# Patient Record
Sex: Male | Born: 1950 | Race: White | Hispanic: No
Health system: Western US, Academic
[De-identification: ages and names within clinical notes are randomized; demographics above are authoritative.]

## PROBLEM LIST (undated history)

## (undated) DIAGNOSIS — I480 Paroxysmal atrial fibrillation: Secondary | ICD-10-CM

## (undated) DIAGNOSIS — Z860100 Personal history of colon polyps, unspecified: Secondary | ICD-10-CM

## (undated) DIAGNOSIS — K649 Unspecified hemorrhoids: Secondary | ICD-10-CM

## (undated) DIAGNOSIS — H9190 Unspecified hearing loss, unspecified ear: Secondary | ICD-10-CM

## (undated) DIAGNOSIS — H269 Unspecified cataract: Secondary | ICD-10-CM

## (undated) DIAGNOSIS — I499 Cardiac arrhythmia, unspecified: Secondary | ICD-10-CM

## (undated) DIAGNOSIS — M1990 Unspecified osteoarthritis, unspecified site: Secondary | ICD-10-CM

## (undated) DIAGNOSIS — I10 Essential (primary) hypertension: Secondary | ICD-10-CM

## (undated) HISTORY — DX: Paroxysmal atrial fibrillation: I48.0

## (undated) HISTORY — DX: Personal history of colon polyps, unspecified: Z86.0100

## (undated) HISTORY — PX: KNEE ARTHROSCOPY: SHX127

## (undated) HISTORY — DX: Unspecified cataract: H26.9

## (undated) HISTORY — DX: Unspecified hemorrhoids: K64.9

## (undated) HISTORY — DX: Personal history of colonic polyps: Z86.010

---

## 2002-06-22 ENCOUNTER — Encounter

## 2004-08-27 ENCOUNTER — Ambulatory Visit

## 2005-06-23 HISTORY — PX: COLONOSCOPY: SHX174

## 2005-06-24 ENCOUNTER — Ambulatory Visit: Payer: Self-pay | Admitting: Internal Medicine

## 2005-07-04 ENCOUNTER — Encounter (INDEPENDENT_AMBULATORY_CARE_PROVIDER_SITE_OTHER): Payer: Self-pay | Admitting: *Deleted

## 2005-07-04 ENCOUNTER — Ambulatory Visit: Payer: Self-pay | Admitting: Internal Medicine

## 2005-07-04 LAB — HM COLONOSCOPY

## 2006-01-23 ENCOUNTER — Ambulatory Visit: Payer: Self-pay | Admitting: Family Medicine

## 2006-06-26 ENCOUNTER — Ambulatory Visit: Payer: Self-pay | Admitting: Family Medicine

## 2006-07-21 ENCOUNTER — Encounter

## 2006-08-21 ENCOUNTER — Ambulatory Visit: Payer: Self-pay | Admitting: Family Medicine

## 2006-08-25 ENCOUNTER — Encounter (HOSPITAL_BASED_OUTPATIENT_CLINIC_OR_DEPARTMENT_OTHER)

## 2008-02-29 ENCOUNTER — Ambulatory Visit: Payer: Self-pay | Admitting: Family Medicine

## 2010-06-23 DIAGNOSIS — I480 Paroxysmal atrial fibrillation: Secondary | ICD-10-CM

## 2010-06-23 HISTORY — DX: Paroxysmal atrial fibrillation: I48.0

## 2010-07-02 ENCOUNTER — Encounter: Payer: Self-pay | Admitting: Internal Medicine

## 2010-07-25 NOTE — Letter (Signed)
Summary: Colonoscopy Letter  [facility]  [address] [place], [place] [address]   Phone: [phone]  Fax: [phone]      July 02, 2010 MRN: [id number]   Rosalie HUNZEKER [address] [place], [place]  [address]   Dear Mr. NICOLOSI,   According to your medical record, it is time for you to schedule a Colonoscopy. The American Cancer Society recommends this procedure as a method to detect early colon cancer. Patients with a family history of colon cancer, or a personal history of colon polyps or inflammatory bowel disease are at increased risk.  This letter has been generated based on the recommendations made at the time of your procedure. If you feel that in your particular situation this may no longer apply, please contact our office.  Please call our office at [phone] to schedule this appointment or to update your records at your earliest convenience.  Thank you for cooperating with [place] to provide you with the very best care possible.   Sincerely,  Hedwig Morton. Juanda Chance, M.D.  [facility] [phone]

## 2010-11-11 ENCOUNTER — Encounter: Payer: Self-pay | Admitting: Family Medicine

## 2010-11-11 ENCOUNTER — Ambulatory Visit (INDEPENDENT_AMBULATORY_CARE_PROVIDER_SITE_OTHER): Payer: Federal, State, Local not specified - PPO | Admitting: Family Medicine

## 2010-11-11 ENCOUNTER — Ambulatory Visit
Admission: RE | Admit: 2010-11-11 | Discharge: 2010-11-11 | Disposition: A | Discharge status: Home / Self Care | Payer: Federal, State, Local not specified - PPO | Source: Ambulatory Visit | Attending: Family Medicine | Admitting: Family Medicine

## 2010-11-11 VITALS — BP 126/80 | HR 74 | Ht 69.6 in | Wt 190.0 lb

## 2010-11-11 DIAGNOSIS — M25512 Pain in left shoulder: Secondary | ICD-10-CM

## 2010-11-11 DIAGNOSIS — Z0000 Encounter for general adult medical examination without abnormal findings: Secondary | ICD-10-CM

## 2010-11-11 DIAGNOSIS — R000 Tachycardia, unspecified: Secondary | ICD-10-CM

## 2010-11-11 DIAGNOSIS — Z8042 Family history of malignant neoplasm of prostate: Secondary | ICD-10-CM

## 2010-11-11 DIAGNOSIS — M25519 Pain in unspecified shoulder: Secondary | ICD-10-CM

## 2010-11-11 LAB — COMPREHENSIVE METABOLIC PANEL
ALT: 19 U/L (ref 0–53)
AST: 21 U/L (ref 0–37)
Albumin: 4.7 g/dL (ref 3.5–5.2)
CO2: 27 mEq/L (ref 19–32)
Calcium: 9.7 mg/dL (ref 8.4–10.5)
Chloride: 101 mEq/L (ref 96–112)
Potassium: 4.5 mEq/L (ref 3.5–5.3)
Sodium: 139 mEq/L (ref 135–145)
Total Protein: 6.9 g/dL (ref 6.0–8.3)

## 2010-11-11 LAB — CBC WITH DIFFERENTIAL/PLATELET
Basophils Absolute: 0 10*3/uL (ref 0.0–0.1)
Basophils Relative: 1 % (ref 0–1)
Eosinophils Absolute: 0.2 10*3/uL (ref 0.0–0.7)
HCT: 39 % (ref 39.0–52.0)
Hemoglobin: 13.6 g/dL (ref 13.0–17.0)
MCH: 32.1 pg (ref 26.0–34.0)
MCHC: 34.9 g/dL (ref 30.0–36.0)
Monocytes Absolute: 0.6 10*3/uL (ref 0.1–1.0)
Monocytes Relative: 8 % (ref 3–12)
Neutro Abs: 4.1 10*3/uL (ref 1.7–7.7)
RDW: 13 % (ref 11.5–15.5)

## 2010-11-11 LAB — TSH: TSH: 2.023 u[IU]/mL (ref 0.350–4.500)

## 2010-11-11 LAB — POCT URINALYSIS DIPSTICK
Blood, UA: NEGATIVE
Ketones, UA: NEGATIVE
Leukocytes, UA: NEGATIVE
Nitrite, UA: NEGATIVE
Protein, UA: NEGATIVE
pH, UA: 6

## 2010-11-11 LAB — LIPID PANEL
LDL Cholesterol: 139 mg/dL — ABNORMAL HIGH (ref 0–99)
Triglycerides: 73 mg/dL (ref <150)
VLDL: 15 mg/dL (ref 0–40)

## 2010-11-11 NOTE — Progress Notes (Signed)
  Subjective:    Patient ID: Matthew Mason, male    DOB: [birth year], 60 y.o.   MRN: [id number]  HPI he is here for a complete examination. He states that he has had almost daily occurrences of rapid heart rate to approximately 150. The last for approximately 1 minute but no associated shortness of breath, weakness or pain. His had no skin or hair changes. Mother apparently did have an irregular heart rate requiring an intervention. His father died of an MI. He also complains of left shoulder pain. He notes that certain positions make it worse. He has no history of previous injury. No popping, locking or grinding. There is a family history of prostate cancer. His social and family history were reviewed. He continues to drink at least 5 beers per night. His marriage is going well.   Review of Systems  Constitutional: Negative.   HENT: Negative.   Eyes: Negative.   Respiratory: Negative.   Gastrointestinal: Negative.   Genitourinary: Negative.   Skin: Negative.   Neurological: Negative.   Hematological: Negative.        Objective:   Physical Exam BP 126/80  Pulse 74  Ht 5' 9.6" (1.768 m)  Wt 190 lb (86.183 kg)  BMI 27.58 kg/m2  General Appearance:    Alert, cooperative, no distress, appears stated age  Head:    Normocephalic, without obvious abnormality, atraumatic  Eyes:    PERRL, conjunctiva/corneas clear, EOM's intact, fundi    benign  Ears:    Normal TM's and external ear canals  Nose:   Nares normal, mucosa normal, no drainage or sinus   tenderness  Throat:   Lips, mucosa, and tongue normal; teeth and gums normal  Neck:   Supple, no lymphadenopathy;  thyroid:  no   enlargement/tenderness/nodules; no carotid   bruit or JVD  Back:    Spine nontender, no curvature, ROM normal, no CVA     tenderness  Lungs:     Clear to auscultation bilaterally without wheezes, rales or     ronchi; respirations unlabored  Chest Wall:    No tenderness or deformity   Heart:    Regular rate and  rhythm, S1 and S2 normal, no murmur, rub   or gallop  Breast Exam:    No chest wall tenderness, masses or gynecomastia  Abdomen:     Soft, non-tender, nondistended, normoactive bowel sounds,    no masses, no hepatosplenomegaly  Genitalia:    Normal male external genitalia without lesions.  Testicles without masses.  No inguinal hernias.  Rectal:    Normal sphincter tone, no masses or tenderness; guaiac negative stool.  Prostate smooth, no nodules, not enlarged.  Extremities:   No clubbing, cyanosis or edema  Pulses:   2+ and symmetric all extremities  Skin:   Skin color, texture, turgor normal, no rashes or lesions  Lymph nodes:   Cervical, supraclavicular, and axillary nodes normal  Neurologic:   CNII-XII intact, normal strength, sensation and gait; reflexes 2+ and symmetric throughout          Psych:   Normal mood, affect, hygiene and grooming.    EKG shows no acute changes       Assessment & Plan:  A. chronic problem list I also discussed the fact that he is drinking more alcohol than needed. Strongly encouraged him to cut back on this. I will followup on the x-ray. Will also order an event monitor. Routine blood screening.

## 2010-11-12 ENCOUNTER — Telehealth: Payer: Self-pay

## 2010-11-12 NOTE — Telephone Encounter (Signed)
Pt informed of labs and to call for new apt for shoulder also I mailed diet info along with labs to pt

## 2010-11-22 HISTORY — PX: NM MYOVIEW LTD: HXRAD82

## 2010-11-22 HISTORY — PX: TRANSTHORACIC ECHOCARDIOGRAM: SHX275

## 2010-11-23 ENCOUNTER — Inpatient Hospital Stay (HOSPITAL_COMMUNITY)
Admission: EM | Admit: 2010-11-23 | Discharge: 2010-11-24 | DRG: 139 | Disposition: A | Discharge status: Home / Self Care | Payer: Federal, State, Local not specified - PPO | Attending: Cardiology | Admitting: Cardiology

## 2010-11-23 ENCOUNTER — Emergency Department (HOSPITAL_COMMUNITY): Payer: Federal, State, Local not specified - PPO

## 2010-11-23 DIAGNOSIS — I4891 Unspecified atrial fibrillation: Principal | ICD-10-CM | POA: Diagnosis present

## 2010-11-23 DIAGNOSIS — Z8249 Family history of ischemic heart disease and other diseases of the circulatory system: Secondary | ICD-10-CM

## 2010-11-23 DIAGNOSIS — Z87891 Personal history of nicotine dependence: Secondary | ICD-10-CM

## 2010-11-23 LAB — DIFFERENTIAL
Basophils Relative: 1 % (ref 0–1)
Lymphocytes Relative: 46 % (ref 12–46)
Lymphs Abs: 3.2 10*3/uL (ref 0.7–4.0)
Monocytes Relative: 10 % (ref 3–12)
Neutro Abs: 2.8 10*3/uL (ref 1.7–7.7)
Neutrophils Relative %: 40 % — ABNORMAL LOW (ref 43–77)

## 2010-11-23 LAB — CBC
Hemoglobin: 14.2 g/dL (ref 13.0–17.0)
MCH: 32.3 pg (ref 26.0–34.0)
MCV: 90 fL (ref 78.0–100.0)
RBC: 4.39 MIL/uL (ref 4.22–5.81)

## 2010-11-23 LAB — BASIC METABOLIC PANEL
BUN: 17 mg/dL (ref 6–23)
Calcium: 9.2 mg/dL (ref 8.4–10.5)
Chloride: 103 mEq/L (ref 96–112)
GFR calc Af Amer: >60 mL/min (ref >60)
GFR calc non Af Amer: >60 mL/min (ref >60)
Glucose, Bld: 106 mg/dL — ABNORMAL HIGH (ref 70–99)

## 2010-11-23 LAB — COMPREHENSIVE METABOLIC PANEL WITH GFR
ALT: 19 U/L (ref 0–53)
BUN: 17 mg/dL (ref 6–23)
Calcium: 9.3 mg/dL (ref 8.4–10.5)
Creatinine, Ser: 0.66 mg/dL (ref 0.4–1.5)
Glucose, Bld: 109 mg/dL — ABNORMAL HIGH (ref 70–99)
Sodium: 140 meq/L (ref 135–145)
Total Protein: 7.3 g/dL (ref 6.0–8.3)

## 2010-11-23 LAB — COMPREHENSIVE METABOLIC PANEL
AST: 19 U/L (ref 0–37)
Albumin: 4.3 g/dL (ref 3.5–5.2)
Alkaline Phosphatase: 54 U/L (ref 39–117)
CO2: 26 mEq/L (ref 19–32)
Chloride: 103 mEq/L (ref 96–112)
GFR calc Af Amer: >60 mL/min (ref >60)
GFR calc non Af Amer: >60 mL/min (ref >60)
Potassium: 4 mEq/L (ref 3.5–5.1)
Total Bilirubin: 0.3 mg/dL (ref 0.3–1.2)

## 2010-11-23 LAB — CK TOTAL AND CKMB (NOT AT ARMC): Total CK: 158 U/L (ref 7–232)

## 2010-11-23 LAB — APTT: aPTT: 28 seconds (ref 24–37)

## 2010-11-23 LAB — PROTIME-INR
INR: 0.89 (ref 0.00–1.49)
Prothrombin Time: 12.3 s (ref 11.6–15.2)

## 2010-11-23 LAB — TROPONIN I: Troponin I: <0.3 ng/mL (ref <0.30)

## 2010-11-23 LAB — MAGNESIUM: Magnesium: 2.3 mg/dL (ref 1.5–2.5)

## 2010-11-24 LAB — CBC
MCHC: 35.6 g/dL (ref 30.0–36.0)
Platelets: 236 10*3/uL (ref 150–400)
RDW: 12.5 % (ref 11.5–15.5)
WBC: 6.4 10*3/uL (ref 4.0–10.5)

## 2010-11-24 LAB — BASIC METABOLIC PANEL
CO2: 27 mEq/L (ref 19–32)
Calcium: 8.8 mg/dL (ref 8.4–10.5)
Creatinine, Ser: 0.76 mg/dL (ref 0.4–1.5)
GFR calc Af Amer: >60 mL/min (ref >60)
GFR calc non Af Amer: >60 mL/min (ref >60)

## 2010-11-24 LAB — CARDIAC PANEL(CRET KIN+CKTOT+MB+TROPI)
CK, MB: 2.5 ng/mL (ref 0.3–4.0)
CK, MB: 2.9 ng/mL (ref 0.3–4.0)
Relative Index: 2.5 (ref 0.0–2.5)
Total CK: 118 U/L (ref 7–232)
Total CK: 98 U/L (ref 7–232)
Troponin I: <0.3 ng/mL (ref <0.30)

## 2010-11-24 LAB — LIPID PANEL
Cholesterol: 166 mg/dL (ref 0–200)
VLDL: 17 mg/dL (ref 0–40)

## 2010-11-24 LAB — HEPARIN LEVEL (UNFRACTIONATED)
Heparin Unfractionated: 0.41 IU/mL (ref 0.30–0.70)
Heparin Unfractionated: 0.52 IU/mL (ref 0.30–0.70)

## 2010-11-28 NOTE — Discharge Summary (Signed)
  NAME:  Matthew Mason, Matthew Mason NO.:  [id number]  MEDICAL RECORD NO.:  [id number]           PATIENT TYPE:  I  LOCATION:  2915                         FACILITY:  [facility]  PHYSICIAN:  Landry Corporal, MD DATE OF BIRTH:  08-21-1950  DATE OF ADMISSION:  11/23/2010 DATE OF DISCHARGE:  11/24/2010                              DISCHARGE SUMMARY   DISCHARGE DIAGNOSES: 1. Paroxysmal atrial fibrillation, new onset, converted spontaneously     to sinus rhythm. 2. Remote history of smoking. 3. Family history of coronary artery disease.  HOSPITAL COURSE:  Matthew Mason is a 60 year old male who is followed by Dr. Susann Givens.  He had mentioned to Dr. Susann Givens on a recent history and physical that he had had some palpitations.  The patient was set up for an outpatient monitor.  On the morning of admission around 9:30, he developed some tachycardia which the monitor picked up.  The Monitor Service call us and we had him come to the emergency room.  In the emergency room he was in atrial fibrillation with a ventricular response of 90-140.  He was started on IV diltiazem and aspirin.  He converted spontaneously sometime after admission.  We feel he can be discharged later on November 24, 2010.  He will follow up with Dr. Herbie Baltimore as an outpatient.  He will need an outpatient stress test and echocardiogram. He is in sinus rhythm at discharge.  LABORATORY DATA:  White count 6.4, hemoglobin 13.8, hematocrit 38.8, platelets 236.  Sodium 140, potassium 4.2, BUN 15, creatinine 0.76.  CK- MB and troponins were negative.  Hemoglobin A1c is 5.5.  TSH 2.19. Cholesterol is 166, triglyceride 83, HDL 56, LDL 93.  Chest x-ray shows no active disease.  EKG on November 24, 2010, shows sinus rhythm, sinus bradycardia 58.  DISPOSITION:  The patient is discharged in stable condition.  He will go home on aspirin and diltiazem.  See med rec for complete details.  He will have his outpatient stress test and  echocardiogram.     Abelino Derrick, P.A.   ______________________________ Landry Corporal, MD    LKK/MEDQ  D:  11/24/2010  T:  11/25/2010  Job:  [id number]  cc:   Sharlot Gowda, M.D.  Electronically Signed by Corine Shelter P.A. on 11/25/2010 02:15:08 PM Electronically Signed by Bryan Lemma MD on 11/28/2010 12:44:31 AM

## 2010-11-28 NOTE — H&P (Signed)
NAME:  Matthew Mason, Matthew Mason NO.:  [id number]  MEDICAL RECORD NO.:  [id number]           PATIENT TYPE:  E  LOCATION:  [facility]                         FACILITY:  [facility]  PHYSICIAN:  Landry Corporal, MD DATE OF BIRTH:  August 27, 1950  DATE OF ADMISSION:  11/23/2010 DATE OF DISCHARGE:                             HISTORY & PHYSICAL   CHIEF COMPLAINTS:  Palpitations.  HISTORY OF PRESENT ILLNESS:  Mr. Dec is a pleasant 60 year old male with no prior history of coronary artery disease.  He is followed by Dr. Susann Givens.  Couple weeks ago, he had a routine physical.  He happened to mention that he had been having some episodes of palpitations.  Dr. Susann Givens set him up for a outpatient monitor, he was in sinus rhythm at the office visit.  This morning, he felt his heart rate become irregular suddenly around 9:30.  The monitor alarm went off.  He is now seen in the emergency room.  He is in fact and what appears to be coarse atrial fibrillation with a ventricular response of 90-130.  He is tolerating this well.  He denies any chest pain or shortness of breath.  He is admitted now for further evaluation.  PAST MEDICAL HISTORY:  Unremarkable for any significant medical problems, he denies hypertension and diabetes.  He has not had previous problems with arrhythmias.  His only surgery has been arthroscopic knee surgery in the past.  MEDICATIONS:  None.  ALLERGIES:  He has no known drug allergies.  SOCIAL HISTORY:  He is married.  He has two children, three grandchildren.  Quit smoking in 2000.  He is a retired Dietitian.  He is active, he goes to the gym 5 days a week.  FAMILY HISTORY:  Remarkable as father died at 49 of a heart attack.  His mother is alive and well at 80 and has atrial fibrillation.  REVIEW OF SYSTEMS:  Essentially unremarkable except as noted above.  He has no history of GI bleeding or melena.  He has not had recent fever or chills.  He denies any  exertional chest pain or anginal sounding symptoms.  He has not had unusual dyspnea.  PHYSICAL EXAM:  VITAL SIGNS:  Blood pressure 122/84, heart rate 120, O2 sat is 98% on room air, temperature 98. GENERAL:  He is a well-developed, well-nourished male, in no acute distress. HEENT:  Normocephalic, atraumatic.  Extraocular muscles are intact. Sclera is nonicteric. NECK:  Without JVD or bruit. CHEST:  Clear to auscultation to percussion. CARDIAC:  Irregularly irregular rhythm without obvious murmur, rub or gallop. ABDOMEN:  Nontender, nondistended.  No bruits. EXTREMITIES:  Without edema.  Distal pulses are 3+/4 bilaterally. NEURO:  Grossly intact.  He is awake, alert and oriented, cooperative. Moves all extremities without obvious deficit. SKIN:  Cool and dry.  LABORATORY DATA:  Labs are pending.  His EKG shows atrial fibrillation with variable ventricular response.  IMPRESSION: 1. Atrial fibrillation, onset 9:30 a.m. today. 2. Remote history of smoking. 3. Family history of coronary artery disease. 4. Unknown lipid status.  PLAN:  The patient will be admitted to telemetry, started on IV  diltiazem and heparin.  We will check labs including TSH and fasting lipids.  He has not converted in the morning, we could discontinue, cardiovert him.  He will need further cardiac evaluation including an echocardiogram and stress test, these could be done as an outpatient.     Abelino Derrick, P.A.   ______________________________ Landry Corporal, MD    LKK/MEDQ  D:  11/23/2010  T:  11/23/2010  Job:  [id number]  cc:   Sharlot Gowda, M.D.  Electronically Signed by Corine Shelter P.A. on 11/25/2010 02:15:05 PM Electronically Signed by Bryan Lemma MD on 11/28/2010 12:44:19 AM

## 2010-12-23 ENCOUNTER — Encounter: Payer: Self-pay | Admitting: Family Medicine

## 2012-03-08 ENCOUNTER — Encounter: Payer: Self-pay | Admitting: Internal Medicine

## 2012-05-04 ENCOUNTER — Encounter: Payer: Self-pay | Admitting: Internal Medicine

## 2012-05-13 ENCOUNTER — Encounter: Payer: Self-pay | Admitting: Family Medicine

## 2012-05-13 ENCOUNTER — Ambulatory Visit (INDEPENDENT_AMBULATORY_CARE_PROVIDER_SITE_OTHER): Payer: Federal, State, Local not specified - PPO | Admitting: Family Medicine

## 2012-05-13 VITALS — BP 122/70 | HR 62 | Ht 68.0 in | Wt 178.0 lb

## 2012-05-13 DIAGNOSIS — I10 Essential (primary) hypertension: Secondary | ICD-10-CM

## 2012-05-13 DIAGNOSIS — Z8042 Family history of malignant neoplasm of prostate: Secondary | ICD-10-CM

## 2012-05-13 DIAGNOSIS — Z0000 Encounter for general adult medical examination without abnormal findings: Secondary | ICD-10-CM

## 2012-05-13 DIAGNOSIS — F1010 Alcohol abuse, uncomplicated: Secondary | ICD-10-CM

## 2012-05-13 DIAGNOSIS — Z789 Other specified health status: Secondary | ICD-10-CM

## 2012-05-13 DIAGNOSIS — Z2911 Encounter for prophylactic immunotherapy for respiratory syncytial virus (RSV): Secondary | ICD-10-CM

## 2012-05-13 DIAGNOSIS — L918 Other hypertrophic disorders of the skin: Secondary | ICD-10-CM

## 2012-05-13 DIAGNOSIS — L909 Atrophic disorder of skin, unspecified: Secondary | ICD-10-CM

## 2012-05-13 DIAGNOSIS — I4891 Unspecified atrial fibrillation: Secondary | ICD-10-CM

## 2012-05-13 DIAGNOSIS — Z23 Encounter for immunization: Secondary | ICD-10-CM

## 2012-05-13 DIAGNOSIS — I480 Paroxysmal atrial fibrillation: Secondary | ICD-10-CM

## 2012-05-13 LAB — CBC WITH DIFFERENTIAL/PLATELET
Hemoglobin: 14 g/dL (ref 13.0–17.0)
Lymphs Abs: 1.6 10*3/uL (ref 0.7–4.0)
Monocytes Relative: 14 % — ABNORMAL HIGH (ref 3–12)
Neutro Abs: 3.1 10*3/uL (ref 1.7–7.7)
Neutrophils Relative %: 53 % (ref 43–77)
RBC: 4.3 MIL/uL (ref 4.22–5.81)

## 2012-05-13 LAB — POCT URINALYSIS DIPSTICK
Blood, UA: NEGATIVE
Nitrite, UA: NEGATIVE
Spec Grav, UA: 1.025
Urobilinogen, UA: NEGATIVE

## 2012-05-13 LAB — COMPREHENSIVE METABOLIC PANEL
AST: 182 U/L — ABNORMAL HIGH (ref 0–37)
BUN: 10 mg/dL (ref 6–23)
Calcium: 9 mg/dL (ref 8.4–10.5)
Chloride: 103 mEq/L (ref 96–112)
Creat: 0.77 mg/dL (ref 0.50–1.35)
Total Bilirubin: 0.8 mg/dL (ref 0.3–1.2)

## 2012-05-13 LAB — LIPID PANEL
Cholesterol: 189 mg/dL (ref 0–200)
LDL Cholesterol: 91 mg/dL (ref 0–99)
VLDL: 12 mg/dL (ref 0–40)

## 2012-05-13 LAB — PSA: PSA: 0.81 ng/mL (ref <=4.00)

## 2012-05-13 NOTE — Progress Notes (Signed)
Subjective:    Patient ID: Matthew Mason, male    DOB: 1950-08-25, 61 y.o.   MRN: [id number]  HPI He is here for complete examination. He did state that he and his wife are getting ready to get a divorce. Apparently she had an extramarital affair. Since she has left he does feel psychologically much better under a lot less stress. He continues to drink at least 4 beverages per night and is truly not interested in quitting. He only drinks at home. He also has skin tags in the neck and axillary area that tends to bother him intermittently. He is now on blood pressure medication as well as Xarelto for treatment of his PAT. He last saw a cardiologist in April. Family and social history were reviewed. He has no other concerns or complaints.   Review of Systems  Constitutional: Negative.   HENT: Negative.   Eyes: Negative.   Respiratory: Negative.   Cardiovascular: Negative.   Gastrointestinal: Negative.   Genitourinary: Negative.   Musculoskeletal: Negative.   Neurological: Negative.   Hematological: Negative.   Psychiatric/Behavioral: Negative.        Objective:   Physical Exam BP 122/70  Pulse 62  Ht 5\' 8"  (1.727 m)  Wt 178 lb (80.74 kg)  BMI 27.06 kg/m2  General Appearance:    Alert, cooperative, no distress, appears stated age  Head:    Normocephalic, without obvious abnormality, atraumatic  Eyes:    PERRL, conjunctiva/corneas clear, EOM's intact, fundi    benign  Ears:    Normal TM's and external ear canals  Nose:   Nares normal, mucosa normal, no drainage or sinus   tenderness  Throat:   Lips, mucosa, and tongue normal; teeth and gums normal  Neck:   Supple, no lymphadenopathy;  thyroid:  no   enlargement/tenderness/nodules; no carotid   bruit or JVD  Back:    Spine nontender, no curvature, ROM normal, no CVA     tenderness  Lungs:     Clear to auscultation bilaterally without wheezes, rales or     ronchi; respirations unlabored  Chest Wall:    No tenderness or  deformity   Heart:    Regular rate and rhythm, S1 and S2 normal, no murmur, rub   or gallop  Breast Exam:    No chest wall tenderness, masses or gynecomastia  Abdomen:     Soft, non-tender, nondistended, normoactive bowel sounds,    no masses, no hepatosplenomegaly  Genitalia:   deferred   Rectal:   deferred   Extremities:   No clubbing, cyanosis or edema  Pulses:   2+ and symmetric all extremities  Skin:   Skin color, texture, turgor normal, no rashes.skin tags noted in the neck as well as axillary area.   Lymph nodes:   Cervical, supraclavicular, and axillary nodes normal  Neurologic:   CNII-XII intact, normal strength, sensation and gait; reflexes 2+ and symmetric throughout          Psych:   Normal mood, affect, hygiene and grooming.           Assessment & Plan:   1. Routine general medical examination at a health care facility  POCT Urinalysis Dipstick, Tdap vaccine greater than or equal to 7yo IM, Flu vaccine greater than or equal to 3yo preservative free IM, Varicella-zoster vaccine subcutaneous, Lipid panel, CBC with Differential, Comprehensive metabolic panel, PSA  2. Family history of prostate cancer  PSA  3. Heavy alcohol use  CBC with Differential, Comprehensive metabolic  panel  4. Hypertension    5. Paroxysmal atrial fibrillation    6. Cutaneous skin tags     review his record indicates need for followup colonoscopy. I recommended that he set this up. He is also to return here for skin tag excision. Discussed his alcohol use and strongly encouraged him to decrease this to one or 2 beverages per night. Continue on present medication regimen. His immunizations were updated. We discussed risk and benefits.

## 2012-05-14 NOTE — Progress Notes (Signed)
Quick Note:  Pt has an appt Monday ______

## 2012-05-17 ENCOUNTER — Ambulatory Visit (INDEPENDENT_AMBULATORY_CARE_PROVIDER_SITE_OTHER): Payer: Federal, State, Local not specified - PPO | Admitting: Family Medicine

## 2012-05-17 DIAGNOSIS — R748 Abnormal levels of other serum enzymes: Secondary | ICD-10-CM

## 2012-05-17 NOTE — Progress Notes (Signed)
  Subjective:    Patient ID: Matthew Mason, male    DOB: 04-Dec-1950, 61 y.o.   MRN: [id number]  HPI He is here for consult. Recent blood work did show elevated liver enzymes. Review his record indicates they were normal a little over a year ago.   Review of Systems     Objective:   Physical Exam Alert and in no distress otherwise not examined       Assessment & Plan:   1. Elevated liver enzymes    I discussed the liver enzymes in regard to his alcohol consumption. Explained that the liver enzymes are most likely related to this. Encouraged him to abstain from all alcohol consumption. He agreed to do this. He will return here in 6 weeks for recheck on his liver enzymes. We'll also remove his skin tags at that time.

## 2012-05-25 ENCOUNTER — Encounter: Payer: Self-pay | Admitting: Family Medicine

## 2012-06-28 ENCOUNTER — Other Ambulatory Visit: Payer: Self-pay | Admitting: Family Medicine

## 2012-06-28 ENCOUNTER — Ambulatory Visit (INDEPENDENT_AMBULATORY_CARE_PROVIDER_SITE_OTHER): Payer: Federal, State, Local not specified - PPO | Admitting: Family Medicine

## 2012-06-28 ENCOUNTER — Encounter: Payer: Self-pay | Admitting: Family Medicine

## 2012-06-28 VITALS — BP 124/78 | HR 56 | Wt 169.0 lb

## 2012-06-28 DIAGNOSIS — R7402 Elevation of levels of lactic acid dehydrogenase (LDH): Secondary | ICD-10-CM

## 2012-06-28 DIAGNOSIS — L918 Other hypertrophic disorders of the skin: Secondary | ICD-10-CM

## 2012-06-28 NOTE — Progress Notes (Signed)
  Subjective:    Patient ID: Matthew Mason, male    DOB: [birth year], 62 y.o.   MRN: [id number]  HPI He is here for a recheck. He notes a 9 pound weight loss, sleeping better and less difficulty with A. fib. Some notes that he has more money in his pocket. He also has several skin tags that he would like removed.   Review of Systems     Objective:   Physical Exam Alert and in no distress. Small skin tag on left side of neck was removed without difficulty. He also has one in the right axilla that was cleaned with alcohol and clipped. The base was hyfrecated with silver nitrate.       Assessment & Plan:   1. Nonspecific elevation of levels of transaminase or lactic acid dehydrogenase (LDH)  Comprehensive metabolic panel  2. Cutaneous skin tags

## 2012-06-29 ENCOUNTER — Other Ambulatory Visit: Payer: Self-pay

## 2012-06-29 DIAGNOSIS — R748 Abnormal levels of other serum enzymes: Secondary | ICD-10-CM

## 2012-06-29 LAB — HEPATITIS B SURFACE ANTIGEN: Hepatitis B Surface Ag: NEGATIVE

## 2012-06-29 LAB — COMPREHENSIVE METABOLIC PANEL
ALT: 234 U/L — ABNORMAL HIGH (ref 0–53)
AST: 105 U/L — ABNORMAL HIGH (ref 0–37)
Albumin: 4.2 g/dL (ref 3.5–5.2)
BUN: 12 mg/dL (ref 6–23)
CO2: 29 mEq/L (ref 19–32)
Calcium: 9 mg/dL (ref 8.4–10.5)
Chloride: 104 mEq/L (ref 96–112)
Potassium: 4.3 mEq/L (ref 3.5–5.3)

## 2012-06-29 NOTE — Progress Notes (Signed)
Quick Note:  Pt informed more labs being run and pt has apt at [employer] [facility]. 301 jan 10 at 9 am pt aware ______

## 2012-06-29 NOTE — Progress Notes (Signed)
Quick Note:  Let him know that the enzymes are better but still not normal. See if we can do hepatitis B and C. on his blood. Also order a liver ultrasound. ______

## 2012-07-02 ENCOUNTER — Other Ambulatory Visit: Payer: Federal, State, Local not specified - PPO

## 2012-07-05 ENCOUNTER — Ambulatory Visit
Admission: RE | Admit: 2012-07-05 | Discharge: 2012-07-05 | Disposition: A | Discharge status: Home / Self Care | Payer: Federal, State, Local not specified - PPO | Source: Ambulatory Visit | Attending: Family Medicine | Admitting: Family Medicine

## 2012-07-05 ENCOUNTER — Other Ambulatory Visit: Payer: Self-pay

## 2012-07-05 ENCOUNTER — Telehealth: Payer: Self-pay

## 2012-07-05 DIAGNOSIS — R748 Abnormal levels of other serum enzymes: Secondary | ICD-10-CM

## 2012-07-05 NOTE — Progress Notes (Signed)
Quick Note:  Pt verbalized understanding   ______

## 2012-07-05 NOTE — Progress Notes (Signed)
Quick Note:  Let him know that the bloodwork does show an improvement and the liver ultrasound did not show anything abnormal. Have him abstain from alcohol again for another couple months and let's repeat this. Let him know that the blood work is not totally back to normal ______

## 2012-07-05 NOTE — Telephone Encounter (Signed)
That's fine it could easily be that. He did have the blood work drawn here

## 2012-07-05 NOTE — Telephone Encounter (Signed)
Called pt about labs he said he seen his cardiologist and he seems to think that pt enzymes were high because of zerelto so he has taken him off for 4 weeks and want to get blood work done again please advise

## 2012-07-05 NOTE — Telephone Encounter (Signed)
Pt has apt to come in feb 3 to lab for blood work ok by [employer]

## 2012-07-26 ENCOUNTER — Other Ambulatory Visit: Payer: Federal, State, Local not specified - PPO

## 2012-07-26 DIAGNOSIS — R748 Abnormal levels of other serum enzymes: Secondary | ICD-10-CM

## 2012-07-26 LAB — COMPREHENSIVE METABOLIC PANEL
AST: 38 U/L — ABNORMAL HIGH (ref 0–37)
Alkaline Phosphatase: 49 U/L (ref 39–117)
BUN: 9 mg/dL (ref 6–23)
Calcium: 9.3 mg/dL (ref 8.4–10.5)
Creat: 0.74 mg/dL (ref 0.50–1.35)

## 2012-07-29 NOTE — Progress Notes (Signed)
Quick Note:  CALLED PT CELL/HOME # PT INFORMED LABS OKAY AND HE VERBALIZED UNDERSTANDING ______

## 2012-11-29 IMAGING — CR DG SHOULDER 2+V*L*
3 series · 3 of 3 positions shown · non-contrast
Comparison: None.

CLINICAL DATA: Left shoulder pain

LEFT SHOULDER - 2+ VIEW

[view not recorded (1 of 3)]
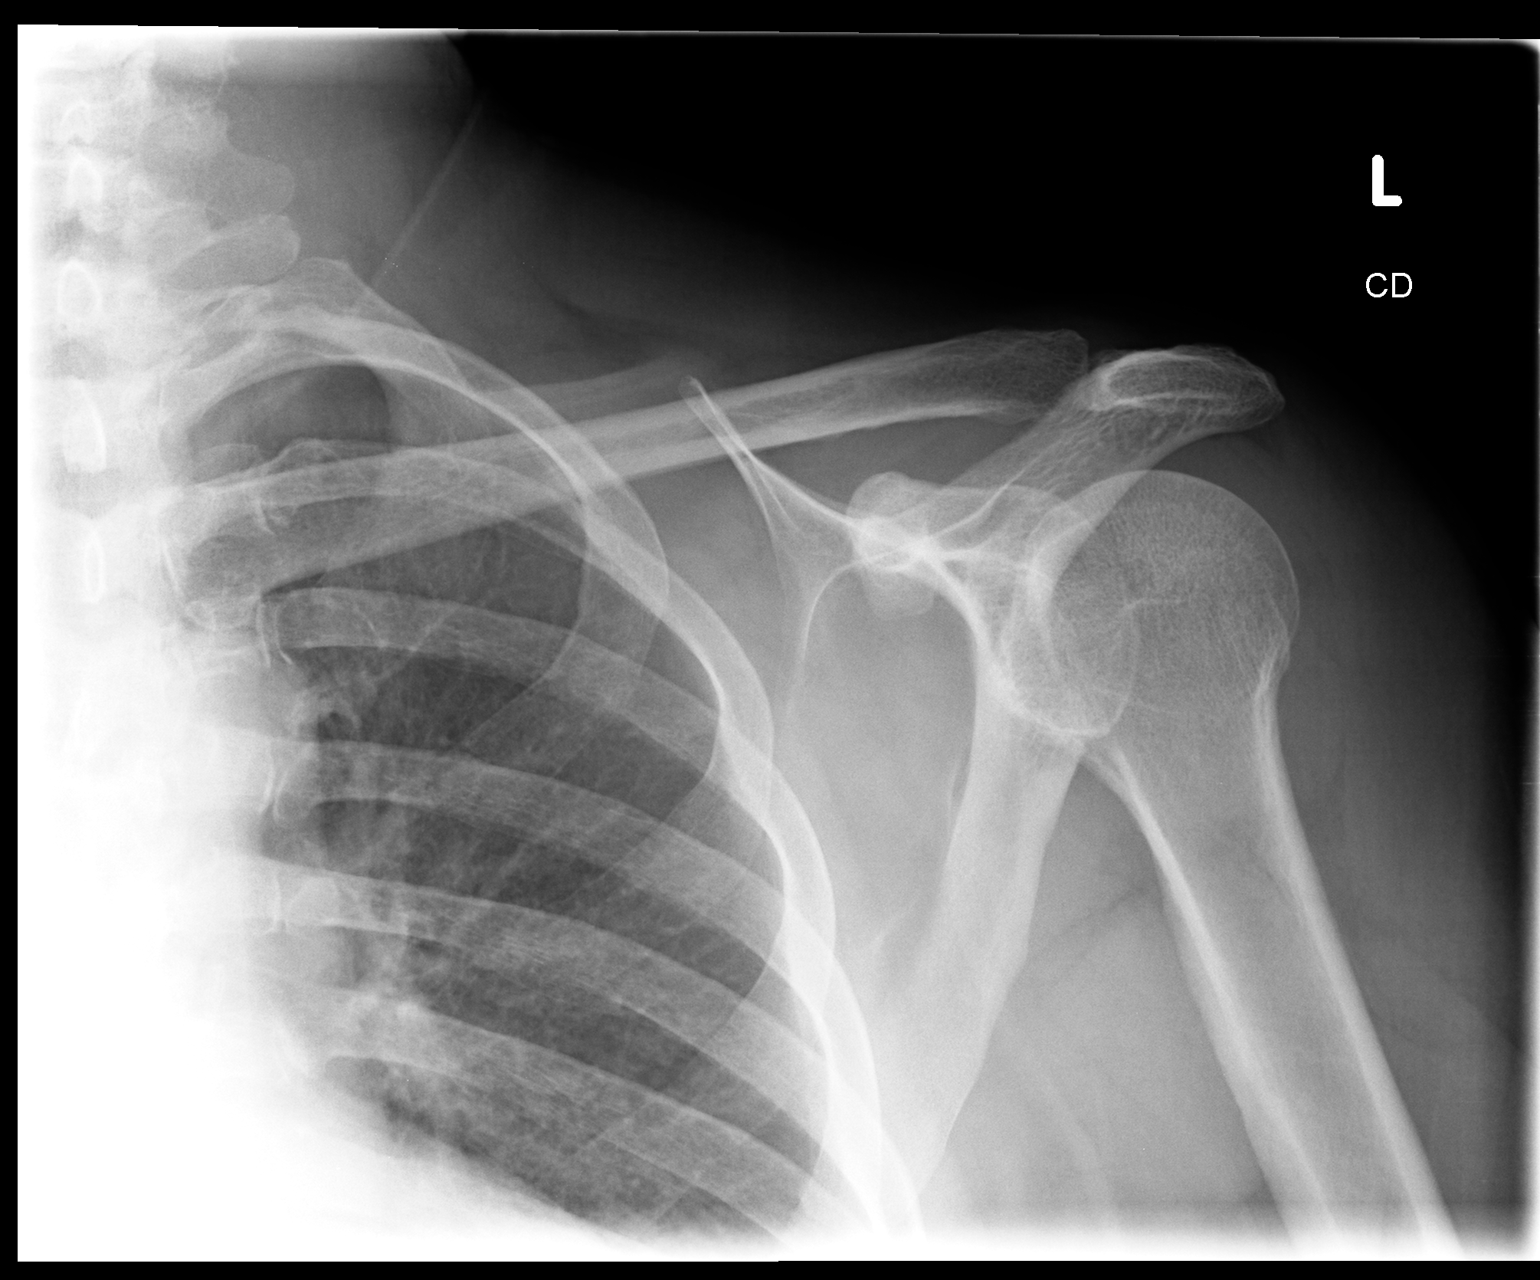

[view not recorded (2 of 3)]
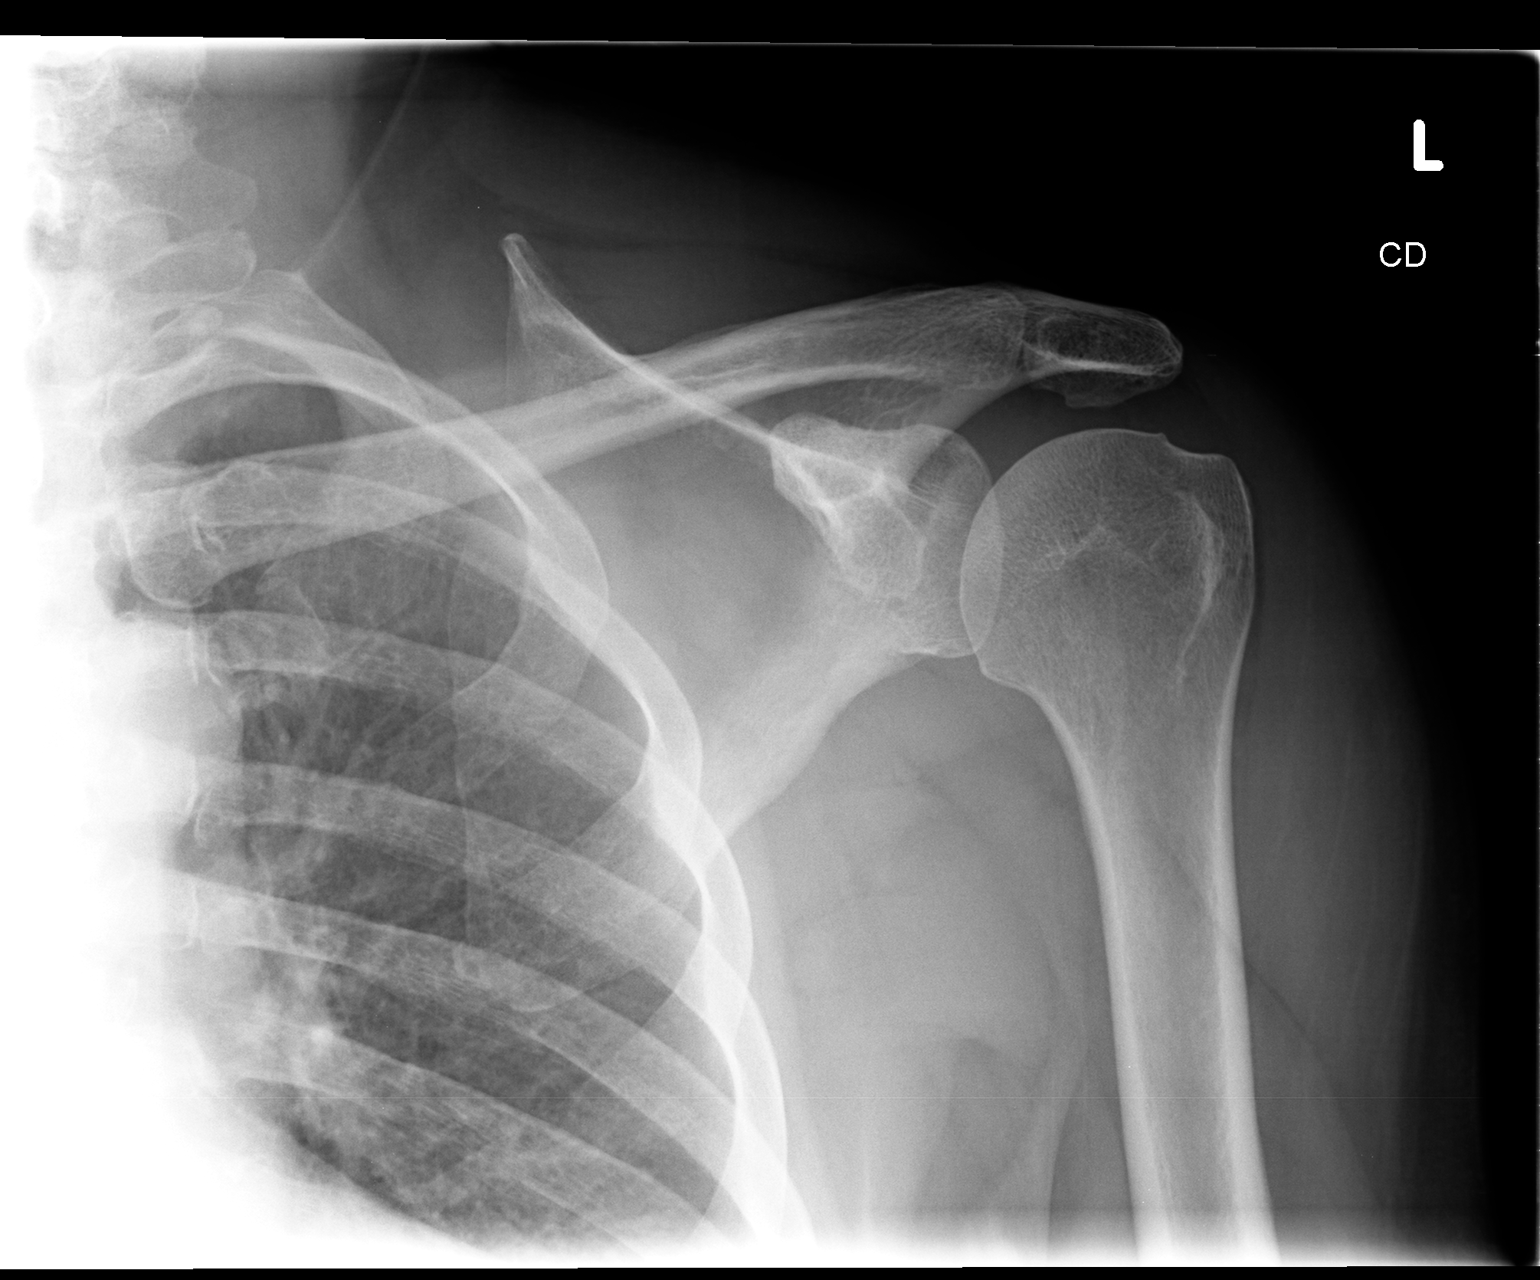

[view not recorded (3 of 3)]
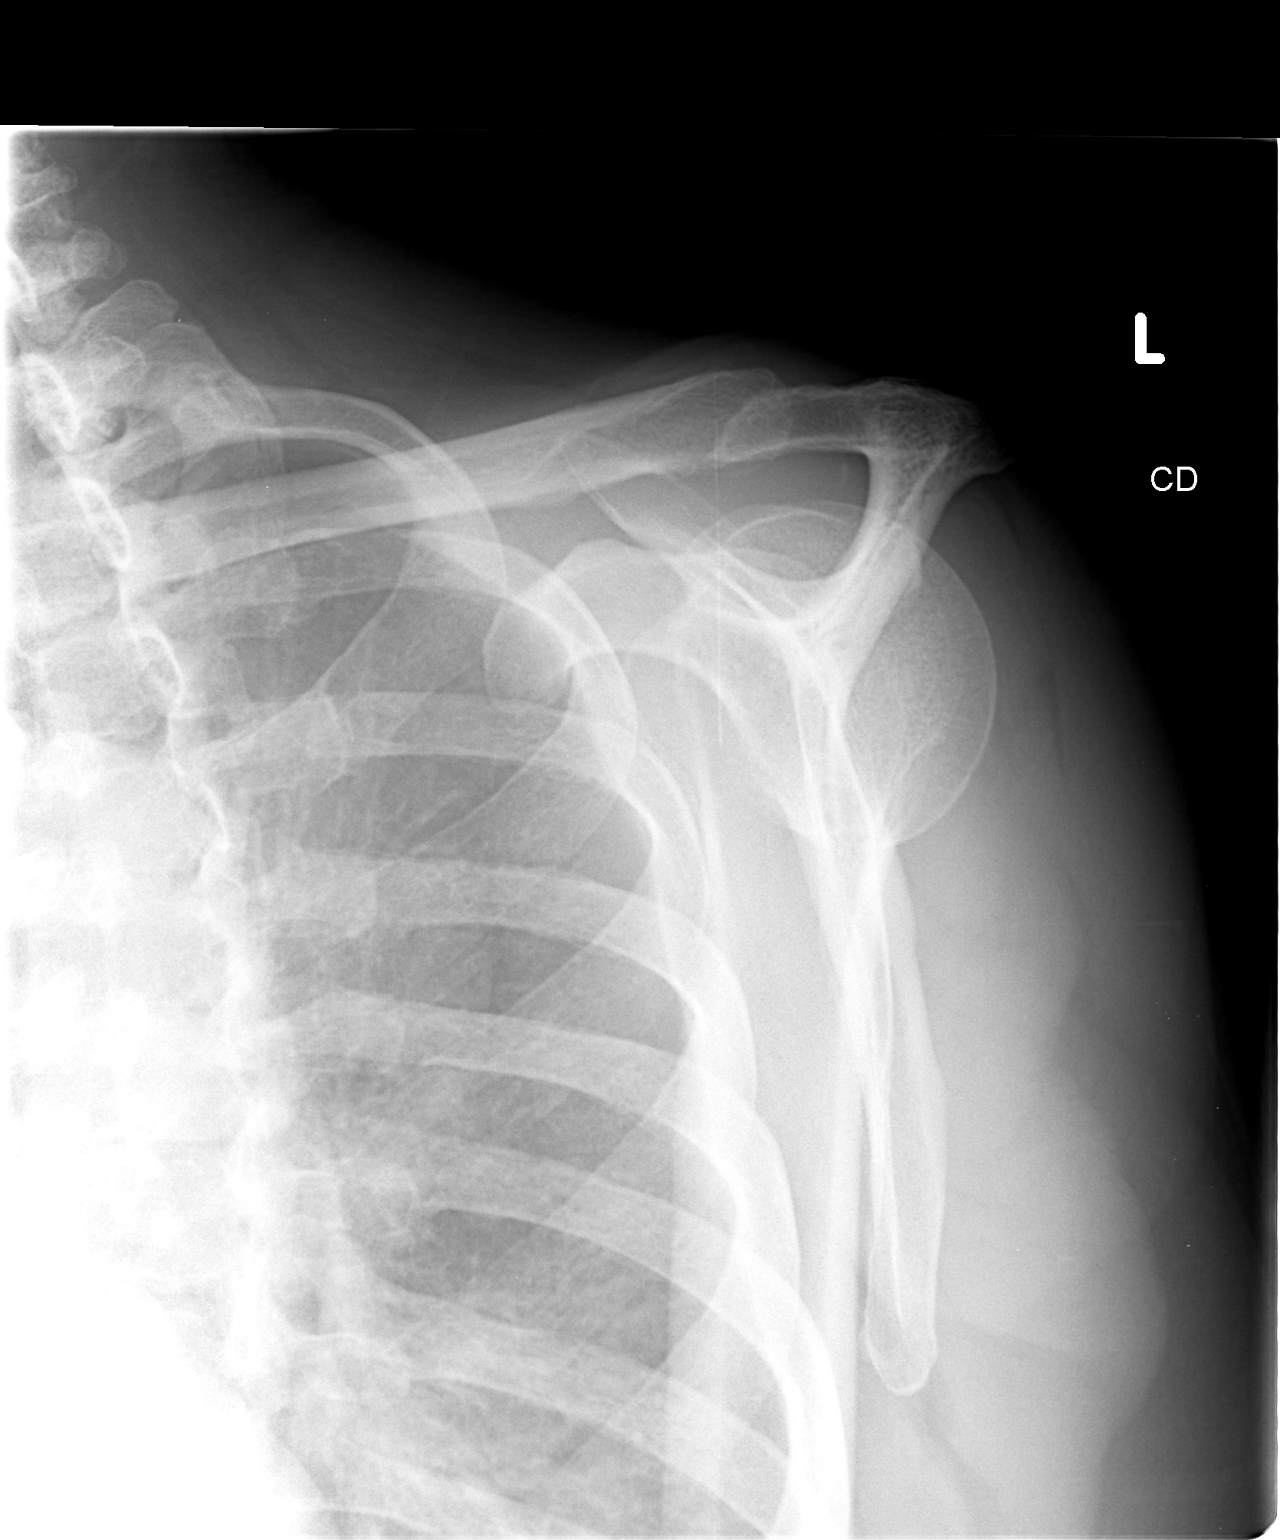

[3 of 3 positions shown; findings below may reference images not displayed]

FINDINGS: No fracture.  No dislocation.  There are no significant
degenerative changes.  The underlying ribs are intact.  Normal soft
tissues.
IMPRESSION: Normal left shoulder radiographs

## 2012-12-13 ENCOUNTER — Other Ambulatory Visit: Payer: Self-pay | Admitting: *Deleted

## 2012-12-13 MED ORDER — LISINOPRIL 5 MG PO TABS: 5.0000 mg | ORAL_TABLET | Freq: Every day | ORAL | Status: DC

## 2012-12-13 NOTE — Telephone Encounter (Signed)
Lisinopril refilled #30 x 11

## 2012-12-14 ENCOUNTER — Other Ambulatory Visit: Payer: Self-pay | Admitting: *Deleted

## 2012-12-14 MED ORDER — LISINOPRIL 10 MG PO TABS: 10.0000 mg | ORAL_TABLET | Freq: Every day | ORAL | Status: DC

## 2012-12-14 NOTE — Telephone Encounter (Signed)
Lisinopril refilled electronically. 

## 2013-03-25 ENCOUNTER — Other Ambulatory Visit: Payer: Self-pay | Admitting: *Deleted

## 2013-03-25 MED ORDER — DILTIAZEM HCL ER COATED BEADS 120 MG PO CP24: ORAL_CAPSULE | ORAL | Status: DC

## 2013-03-25 NOTE — Telephone Encounter (Signed)
Rx was sent to pharmacy electronically. 

## 2013-09-18 ENCOUNTER — Other Ambulatory Visit: Payer: Self-pay | Admitting: Cardiovascular Disease

## 2013-12-05 ENCOUNTER — Ambulatory Visit (INDEPENDENT_AMBULATORY_CARE_PROVIDER_SITE_OTHER): Payer: Federal, State, Local not specified - PPO | Admitting: Family Medicine

## 2013-12-05 ENCOUNTER — Encounter: Payer: Self-pay | Admitting: Family Medicine

## 2013-12-05 VITALS — BP 120/70 | Wt 181.0 lb

## 2013-12-05 DIAGNOSIS — H6120 Impacted cerumen, unspecified ear: Secondary | ICD-10-CM

## 2013-12-05 DIAGNOSIS — H918X9 Other specified hearing loss, unspecified ear: Secondary | ICD-10-CM

## 2013-12-05 NOTE — Progress Notes (Signed)
   Subjective:    Patient ID: Matthew Mason, male    DOB: June 15, 1951, 63 y.o.   MRN: [id number]  HPI He recently had his hearing checked in the had difficulty due to cerumen. He is here for removal. He is having no other difficulties with his hearing.   Review of Systems     Objective:   Physical Exam Alert and in no distress. Cerumen noted in both canals. They were both irrigated with warm water and cleaned without difficulty. TMs were normal. He notes that his hearing did improve after the fact.      Assessment & Plan:  Hearing loss secondary to cerumen impaction  He is to return here in the near future to for a complete exam.

## 2014-01-04 ENCOUNTER — Encounter: Payer: Federal, State, Local not specified - PPO | Admitting: Family Medicine

## 2014-01-26 ENCOUNTER — Encounter: Payer: Self-pay | Admitting: Family Medicine

## 2014-01-26 ENCOUNTER — Ambulatory Visit (INDEPENDENT_AMBULATORY_CARE_PROVIDER_SITE_OTHER): Payer: Federal, State, Local not specified - PPO | Admitting: Family Medicine

## 2014-01-26 VITALS — BP 114/80 | HR 56 | Ht 67.0 in | Wt 180.0 lb

## 2014-01-26 DIAGNOSIS — M25469 Effusion, unspecified knee: Secondary | ICD-10-CM

## 2014-01-26 DIAGNOSIS — H9190 Unspecified hearing loss, unspecified ear: Secondary | ICD-10-CM

## 2014-01-26 DIAGNOSIS — Z0000 Encounter for general adult medical examination without abnormal findings: Secondary | ICD-10-CM

## 2014-01-26 DIAGNOSIS — I4891 Unspecified atrial fibrillation: Secondary | ICD-10-CM

## 2014-01-26 DIAGNOSIS — Z860100 Personal history of colon polyps, unspecified: Secondary | ICD-10-CM

## 2014-01-26 DIAGNOSIS — I480 Paroxysmal atrial fibrillation: Secondary | ICD-10-CM | POA: Insufficient documentation

## 2014-01-26 DIAGNOSIS — H9193 Unspecified hearing loss, bilateral: Secondary | ICD-10-CM | POA: Insufficient documentation

## 2014-01-26 DIAGNOSIS — M25461 Effusion, right knee: Secondary | ICD-10-CM

## 2014-01-26 LAB — CBC WITH DIFFERENTIAL/PLATELET
BASOS ABS: 0.1 10*3/uL (ref 0.0–0.1)
Basophils Relative: 1 % (ref 0–1)
EOS ABS: 0.2 10*3/uL (ref 0.0–0.7)
EOS PCT: 3 % (ref 0–5)
HCT: 38 % — ABNORMAL LOW (ref 39.0–52.0)
Hemoglobin: 13.2 g/dL (ref 13.0–17.0)
LYMPHS PCT: 42 % (ref 12–46)
Lymphs Abs: 2.9 10*3/uL (ref 0.7–4.0)
MCH: 32 pg (ref 26.0–34.0)
MCHC: 34.7 g/dL (ref 30.0–36.0)
MCV: 92 fL (ref 78.0–100.0)
Monocytes Absolute: 0.6 10*3/uL (ref 0.1–1.0)
Monocytes Relative: 9 % (ref 3–12)
NEUTROS PCT: 45 % (ref 43–77)
Neutro Abs: 3.1 10*3/uL (ref 1.7–7.7)
PLATELETS: 184 10*3/uL (ref 150–400)
RBC: 4.13 MIL/uL — AB (ref 4.22–5.81)
RDW: 13.5 % (ref 11.5–15.5)
WBC: 6.8 10*3/uL (ref 4.0–10.5)

## 2014-01-26 NOTE — Progress Notes (Signed)
   Subjective:    Patient ID: ANTOIN Mason, male    DOB: May 08, 1951, 63 y.o.   MRN: [id number]  HPI He is here for complete examination. He does have a history of PAF. He has not been on any medicine other than eloquence for the last several months. He notes only occasional issues of rapid heart rate. He does have a previous history of colonic polyps and is due for followup colonoscopy. He recently started wearing hearing aids due to high frequency hearing loss and is doing well with them. He does complain of intermittent right knee swelling however is not interfering with his playing tennis or golf. Family and social history were reviewed. He is retired but does have 2 part-time jobs to keep him busy. He is dating someone. He otherwise has no concerns or complaints.   Review of Systems  All other systems reviewed and are negative.      Objective:   Physical Exam BP 114/80  Pulse 56  Ht 5\' 7"  (1.702 m)  Wt 180 lb (81.647 kg)  BMI 28.19 kg/m2  General Appearance:    Alert, cooperative, no distress, appears stated age  Head:    Normocephalic, without obvious abnormality, atraumatic  Eyes:    PERRL, conjunctiva/corneas clear, EOM's intact, fundi    benign  Ears:    Normal TM's and external ear canals  Nose:   Nares normal, mucosa normal, no drainage or sinus   tenderness  Throat:   Lips, mucosa, and tongue normal; teeth and gums normal  Neck:   Supple, no lymphadenopathy;  thyroid:  no   enlargement/tenderness/nodules; no carotid   bruit or JVD  Back:    Spine nontender, no curvature, ROM normal, no CVA     tenderness  Lungs:     Clear to auscultation bilaterally without wheezes, rales or     ronchi; respirations unlabored  Chest Wall:    No tenderness or deformity   Heart:    Regular rate and rhythm, S1 and S2 normal, no murmur, rub   or gallop  Breast Exam:    No chest wall tenderness, masses or gynecomastia  Abdomen:     Soft, non-tender, nondistended, normoactive bowel  sounds,    no masses, no hepatosplenomegaly  Genitalia:    Rectal:    Extremities:   No clubbing, cyanosis or edema  Pulses:   2+ and symmetric all extremities  Skin:   Skin color, texture, turgor normal, no rashes or lesions  Lymph nodes:   Cervical, supraclavicular, and axillary nodes normal  Neurologic:   CNII-XII intact, normal strength, sensation and gait; reflexes 2+ and symmetric throughout          Psych:   Normal mood, affect, hygiene and grooming.          Assessment & Plan:  Atrial fibrillation, unspecified - Plan: CBC with Differential, Comprehensive metabolic panel, Lipid panel  Personal history of colonic polyps  Bilateral hearing loss  Knee effusion, right  Routine general medical examination at a health care facility - Plan: CBC with Differential, Comprehensive metabolic panel  he will schedule himself to be seen by GI for followup colonoscopy. He is going to continue to use his hearing aids. Discussed the knee effusion and since he is really having no functional impairment, further intervention is probably not needed. He was comfortable with this. He will follow up with cardiology concerning continuing on his Eliquis

## 2014-01-27 LAB — COMPREHENSIVE METABOLIC PANEL
ALT: 14 U/L (ref 0–53)
AST: 18 U/L (ref 0–37)
Albumin: 4.6 g/dL (ref 3.5–5.2)
Alkaline Phosphatase: 51 U/L (ref 39–117)
BILIRUBIN TOTAL: 1 mg/dL (ref 0.2–1.2)
BUN: 11 mg/dL (ref 6–23)
CO2: 26 mEq/L (ref 19–32)
CREATININE: 0.68 mg/dL (ref 0.50–1.35)
Calcium: 8.8 mg/dL (ref 8.4–10.5)
Chloride: 102 mEq/L (ref 96–112)
Glucose, Bld: 87 mg/dL (ref 70–99)
Potassium: 4 mEq/L (ref 3.5–5.3)
SODIUM: 137 meq/L (ref 135–145)
TOTAL PROTEIN: 6.8 g/dL (ref 6.0–8.3)

## 2014-01-27 LAB — LIPID PANEL
CHOL/HDL RATIO: 2.4 ratio
Cholesterol: 176 mg/dL (ref 0–200)
HDL: 74 mg/dL (ref >39)
LDL CALC: 86 mg/dL (ref 0–99)
Triglycerides: 79 mg/dL (ref <150)
VLDL: 16 mg/dL (ref 0–40)

## 2014-03-20 ENCOUNTER — Other Ambulatory Visit (HOSPITAL_BASED_OUTPATIENT_CLINIC_OR_DEPARTMENT_OTHER): Payer: Self-pay | Admitting: Anatomic Pathology

## 2014-03-25 ENCOUNTER — Inpatient Hospital Stay
Admission: EM | Admit: 2014-03-25 | Discharge: 2014-04-25 | DRG: 270 | Disposition: A | Discharge status: Hospice | Source: Other Acute Inpatient Hospital | Attending: Cardiovascular Disease | Admitting: Cardiovascular Disease

## 2014-03-25 ENCOUNTER — Inpatient Hospital Stay (HOSPITAL_COMMUNITY): Admitting: Cardiovascular Disease

## 2014-03-25 DIAGNOSIS — R251 Tremor, unspecified: Secondary | ICD-10-CM | POA: Diagnosis present

## 2014-03-25 DIAGNOSIS — N183 Chronic kidney disease, stage 3 (moderate): Secondary | ICD-10-CM | POA: Diagnosis present

## 2014-03-25 DIAGNOSIS — E46 Unspecified protein-calorie malnutrition: Secondary | ICD-10-CM | POA: Diagnosis present

## 2014-03-25 DIAGNOSIS — J90 Pleural effusion, not elsewhere classified: Secondary | ICD-10-CM

## 2014-03-25 DIAGNOSIS — Z955 Presence of coronary angioplasty implant and graft: Secondary | ICD-10-CM

## 2014-03-25 DIAGNOSIS — E876 Hypokalemia: Secondary | ICD-10-CM | POA: Diagnosis present

## 2014-03-25 DIAGNOSIS — I5023 Acute on chronic systolic (congestive) heart failure: Secondary | ICD-10-CM | POA: Diagnosis present

## 2014-03-25 DIAGNOSIS — I2510 Atherosclerotic heart disease of native coronary artery without angina pectoris: Secondary | ICD-10-CM | POA: Diagnosis present

## 2014-03-25 DIAGNOSIS — I480 Paroxysmal atrial fibrillation: Secondary | ICD-10-CM | POA: Diagnosis present

## 2014-03-25 DIAGNOSIS — F4322 Adjustment disorder with anxiety: Secondary | ICD-10-CM | POA: Diagnosis present

## 2014-03-25 DIAGNOSIS — K7290 Hepatic failure, unspecified without coma: Secondary | ICD-10-CM | POA: Diagnosis present

## 2014-03-25 DIAGNOSIS — R570 Cardiogenic shock: Secondary | ICD-10-CM | POA: Diagnosis present

## 2014-03-25 DIAGNOSIS — F064 Anxiety disorder due to known physiological condition: Secondary | ICD-10-CM | POA: Diagnosis present

## 2014-03-25 DIAGNOSIS — Z95810 Presence of automatic (implantable) cardiac defibrillator: Secondary | ICD-10-CM

## 2014-03-25 DIAGNOSIS — I130 Hypertensive heart and chronic kidney disease with heart failure and stage 1 through stage 4 chronic kidney disease, or unspecified chronic kidney disease: Principal | ICD-10-CM | POA: Diagnosis present

## 2014-03-25 DIAGNOSIS — E871 Hypo-osmolality and hyponatremia: Secondary | ICD-10-CM | POA: Diagnosis present

## 2014-03-25 DIAGNOSIS — K761 Chronic passive congestion of liver: Secondary | ICD-10-CM | POA: Diagnosis present

## 2014-03-25 DIAGNOSIS — I252 Old myocardial infarction: Secondary | ICD-10-CM

## 2014-03-25 DIAGNOSIS — Z66 Do not resuscitate: Secondary | ICD-10-CM | POA: Diagnosis not present

## 2014-03-25 DIAGNOSIS — I255 Ischemic cardiomyopathy: Secondary | ICD-10-CM | POA: Diagnosis present

## 2014-03-25 DIAGNOSIS — N179 Acute kidney failure, unspecified: Secondary | ICD-10-CM | POA: Diagnosis present

## 2014-03-25 DIAGNOSIS — Z8674 Personal history of sudden cardiac arrest: Secondary | ICD-10-CM

## 2014-03-25 DIAGNOSIS — M109 Gout, unspecified: Secondary | ICD-10-CM | POA: Diagnosis present

## 2014-03-25 LAB — CBC, DIFF
% Basophils: 0 %
% Eosinophils: 2 %
% Immature Granulocytes: 1 %
% Lymphocytes: 14 %
% Monocytes: 13 %
% Neutrophils: 70 %
% Nucleated RBC: 0 %
Absolute Eosinophil Count: 0.14 10*3/uL (ref 0.00–0.50)
Absolute Lymphocyte Count: 0.98 10*3/uL — ABNORMAL LOW (ref 1.00–4.80)
Basophils: 0.03 10*3/uL (ref 0.00–0.20)
Hematocrit: 33 % — ABNORMAL LOW (ref 38–50)
Hemoglobin: 10.8 g/dL — ABNORMAL LOW (ref 13.0–18.0)
Immature Granulocytes: 0.04 10*3/uL (ref 0.00–0.05)
MCH: 31.3 pg (ref 27.3–33.6)
MCHC: 32.8 g/dL (ref 32.2–36.5)
MCV: 95 fL (ref 81–98)
Monocytes: 0.91 10*3/uL — ABNORMAL HIGH (ref 0.00–0.80)
Neutrophils: 4.98 10*3/uL (ref 1.80–7.00)
Nucleated RBC: 0 10*3/uL
Platelet Count: 101 10*3/uL — ABNORMAL LOW (ref 150–400)
RBC: 3.45 mil/uL — ABNORMAL LOW (ref 4.40–5.60)
RDW-CV: 18.8 % — ABNORMAL HIGH (ref 11.6–14.4)
WBC: 7.08 10*3/uL (ref 4.3–10.0)

## 2014-03-25 LAB — BASIC METABOLIC PANEL
Anion Gap: 13 — ABNORMAL HIGH (ref 4–12)
Calcium: 9.2 mg/dL (ref 8.9–10.2)
Carbon Dioxide, Total: 26 mEq/L (ref 22–32)
Chloride: 90 mEq/L — ABNORMAL LOW (ref 98–108)
Creatinine: 1.39 mg/dL — ABNORMAL HIGH (ref 0.51–1.18)
GFR, Calc, African American: >60 mL/min (ref >59)
GFR, Calc, European American: 52 mL/min — ABNORMAL LOW (ref >59)
Glucose: 174 mg/dL — ABNORMAL HIGH (ref 62–125)
Potassium: 3.3 mEq/L — ABNORMAL LOW (ref 3.6–5.2)
Sodium: 129 mEq/L — ABNORMAL LOW (ref 135–145)
Urea Nitrogen: 47 mg/dL — ABNORMAL HIGH (ref 8–21)

## 2014-03-25 LAB — HEPATIC FUNCTION PANEL
ALT (GPT): 28 U/L (ref 10–48)
AST (GOT): 31 U/L (ref 9–38)
Albumin: 3.8 g/dL (ref 3.5–5.2)
Alkaline Phosphatase (Total): 163 U/L — ABNORMAL HIGH (ref 37–159)
Bilirubin (Direct): 1.3 mg/dL — ABNORMAL HIGH (ref 0.0–0.3)
Bilirubin (Total): 2.7 mg/dL — ABNORMAL HIGH (ref 0.2–1.3)
Protein (Total): 6.5 g/dL (ref 6.0–8.2)

## 2014-03-25 LAB — COOXIMETRY PANEL, MIXED VEN
Carboxyhemoglobin, Mixed VEN: 1.5 %
Hemoglobin, MIXED VEN: 11 g/dL — ABNORMAL LOW (ref 13.0–18.0)
Methemoglobin, Mixed VEN: 0.4 % (ref <3.0)
O2 Content, Mixed VEN: 9.2 VOL%
O2 Sat (Frac.), Mixed VEN: 59 %
O2 Sat (Func.), Mixed VEN: 61 %

## 2014-03-25 LAB — PROTHROMBIN & PTT
Partial Thromboplastin Time: 32 s (ref 22–35)
Prothrombin INR: 1.3 (ref 0.8–1.3)
Prothrombin Time Patient: 15.9 s — ABNORMAL HIGH (ref 10.7–15.6)

## 2014-03-25 LAB — B_TYPE NATRIURETIC PEPTIDE: B_Type Natriuretic Peptide: 2000 pg/mL — ABNORMAL HIGH (ref <101)

## 2014-03-25 LAB — MAGNESIUM: Magnesium: 2 mg/dL (ref 1.8–2.4)

## 2014-03-25 LAB — PHOSPHATE: Phosphate: 3.1 mg/dL (ref 2.5–4.5)

## 2014-03-26 DIAGNOSIS — E8770 Fluid overload, unspecified: Secondary | ICD-10-CM

## 2014-03-26 DIAGNOSIS — R9431 Abnormal electrocardiogram [ECG] [EKG]: Secondary | ICD-10-CM

## 2014-03-26 DIAGNOSIS — K7200 Acute and subacute hepatic failure without coma: Secondary | ICD-10-CM

## 2014-03-26 DIAGNOSIS — I255 Ischemic cardiomyopathy: Secondary | ICD-10-CM

## 2014-03-26 DIAGNOSIS — Z136 Encounter for screening for cardiovascular disorders: Secondary | ICD-10-CM

## 2014-03-26 LAB — BASIC METABOLIC PANEL
Anion Gap: 11 (ref 4–12)
Anion Gap: 10 (ref 4–12)
Anion Gap: 7 (ref 4–12)
Calcium: 9.3 mg/dL (ref 8.9–10.2)
Calcium: 9.4 mg/dL (ref 8.9–10.2)
Calcium: 9.3 mg/dL (ref 8.9–10.2)
Carbon Dioxide, Total: 28 mEq/L (ref 22–32)
Carbon Dioxide, Total: 28 mEq/L (ref 22–32)
Carbon Dioxide, Total: 30 mEq/L (ref 22–32)
Chloride: 91 mEq/L — ABNORMAL LOW (ref 98–108)
Chloride: 92 mEq/L — ABNORMAL LOW (ref 98–108)
Chloride: 91 mEq/L — ABNORMAL LOW (ref 98–108)
Creatinine: 1.43 mg/dL — ABNORMAL HIGH (ref 0.51–1.18)
Creatinine: 1.42 mg/dL — ABNORMAL HIGH (ref 0.51–1.18)
Creatinine: 1.45 mg/dL — ABNORMAL HIGH (ref 0.51–1.18)
GFR, Calc, African American: >60 mL/min (ref >59)
GFR, Calc, African American: >60 mL/min (ref >59)
GFR, Calc, African American: 60 mL/min (ref >59)
GFR, Calc, European American: 50 mL/min — ABNORMAL LOW (ref >59)
GFR, Calc, European American: 51 mL/min — ABNORMAL LOW (ref >59)
GFR, Calc, European American: 49 mL/min — ABNORMAL LOW (ref >59)
Glucose: 97 mg/dL (ref 62–125)
Glucose: 114 mg/dL (ref 62–125)
Glucose: 181 mg/dL — ABNORMAL HIGH (ref 62–125)
Potassium: 3.5 mEq/L — ABNORMAL LOW (ref 3.6–5.2)
Potassium: 3.7 mEq/L (ref 3.6–5.2)
Potassium: 3.8 mEq/L (ref 3.6–5.2)
Sodium: 130 mEq/L — ABNORMAL LOW (ref 135–145)
Sodium: 130 mEq/L — ABNORMAL LOW (ref 135–145)
Sodium: 128 mEq/L — ABNORMAL LOW (ref 135–145)
Urea Nitrogen: 47 mg/dL — ABNORMAL HIGH (ref 8–21)
Urea Nitrogen: 44 mg/dL — ABNORMAL HIGH (ref 8–21)
Urea Nitrogen: 43 mg/dL — ABNORMAL HIGH (ref 8–21)

## 2014-03-26 LAB — LAB ADD ON ORDER

## 2014-03-26 LAB — COOX PANEL, VENOUS
Carboxyhemoglobin, VEN: 1.4 %
Carboxyhemoglobin, VEN: 1.7 %
Methemoglobin, VEN: 0.5 % (ref <3.0)
Methemoglobin, VEN: 0.5 % (ref <3.0)
O2 Content, VEN: 8.5 VOL%
O2 Content, VEN: 7.7 VOL%
O2 Saturation (Frac.), VEN: 54 %
O2 Saturation (Frac.), VEN: 49 %
O2 Saturation (Func.), VEN: 55 %
O2 Saturation (Func.), VEN: 50 %
Total Hemoglobin, VEN: 11.2 g/dL — ABNORMAL LOW (ref 13.0–18.0)
Total Hemoglobin, VEN: 11.1 g/dL — ABNORMAL LOW (ref 13.0–18.0)

## 2014-03-26 LAB — URINALYSIS WITH REFLEX CULTURE
Bacteria, URN: NONE SEEN
Bilirubin (Qual), URN: NEGATIVE
Epith Cells_Renal/Trans,URN: NEGATIVE /HPF
Epith Cells_Squamous, URN: NEGATIVE /LPF
Glucose Qual, URN: NEGATIVE mg/dL
Ketones, URN: NEGATIVE mg/dL
Leukocyte Esterase, URN: NEGATIVE
Nitrite, URN: NEGATIVE
Protein (Alb Semiquant), URN: NEGATIVE mg/dL
Specific Gravity, URN: 1.006 g/mL (ref 1.002–1.027)
WBC, URN: NEGATIVE /HPF
pH, URN: 6 (ref 5.0–8.0)

## 2014-03-26 LAB — O2 SAT. PANEL, VEN

## 2014-03-26 LAB — REFLEX CULTURE FOR UA

## 2014-03-26 LAB — PROTHROMBIN & PTT
Partial Thromboplastin Time: 32 s (ref 22–35)
Prothrombin INR: 1.3 (ref 0.8–1.3)
Prothrombin Time Patient: 15.8 s — ABNORMAL HIGH (ref 10.7–15.6)

## 2014-03-26 LAB — COOXIMETRY PANEL, MIXED VEN
Carboxyhemoglobin, Mixed VEN: 1.7 %
Hemoglobin, MIXED VEN: 10.8 g/dL — ABNORMAL LOW (ref 13.0–18.0)
Methemoglobin, Mixed VEN: 0.5 % (ref <3.0)
O2 Content, Mixed VEN: 7.8 VOL%
O2 Sat (Frac.), Mixed VEN: 51 %
O2 Sat (Func.), Mixed VEN: 52 %

## 2014-03-26 LAB — CBC (HEMOGRAM)
Hematocrit: 33 % — ABNORMAL LOW (ref 38–50)
Hemoglobin: 10.9 g/dL — ABNORMAL LOW (ref 13.0–18.0)
MCH: 31.4 pg (ref 27.3–33.6)
MCHC: 33.4 g/dL (ref 32.2–36.5)
MCV: 94 fL (ref 81–98)
Platelet Count: 96 10*3/uL — ABNORMAL LOW (ref 150–400)
RBC: 3.47 mil/uL — ABNORMAL LOW (ref 4.40–5.60)
RDW-CV: 18.7 % — ABNORMAL HIGH (ref 11.6–14.4)
WBC: 6.97 10*3/uL (ref 4.3–10.0)

## 2014-03-26 LAB — MAGNESIUM
Magnesium: 2.1 mg/dL (ref 1.8–2.4)
Magnesium: 2.2 mg/dL (ref 1.8–2.4)
Magnesium: 2.2 mg/dL (ref 1.8–2.4)

## 2014-03-26 LAB — DIGOXIN: Digoxin: 1.7 ng/mL (ref 0.5–1.9)

## 2014-03-26 LAB — PHOSPHATE: Phosphate: 3.1 mg/dL (ref 2.5–4.5)

## 2014-03-27 DIAGNOSIS — K761 Chronic passive congestion of liver: Secondary | ICD-10-CM

## 2014-03-27 DIAGNOSIS — R570 Cardiogenic shock: Secondary | ICD-10-CM

## 2014-03-27 LAB — CBC (HEMOGRAM)
Hematocrit: 32 % — ABNORMAL LOW (ref 38–50)
Hemoglobin: 10.5 g/dL — ABNORMAL LOW (ref 13.0–18.0)
MCH: 31.3 pg (ref 27.3–33.6)
MCHC: 32.8 g/dL (ref 32.2–36.5)
MCV: 96 fL (ref 81–98)
Platelet Count: 92 10*3/uL — ABNORMAL LOW (ref 150–400)
RBC: 3.35 mil/uL — ABNORMAL LOW (ref 4.40–5.60)
RDW-CV: 18.8 % — ABNORMAL HIGH (ref 11.6–14.4)
WBC: 7.75 10*3/uL (ref 4.3–10.0)

## 2014-03-27 LAB — BASIC METABOLIC PANEL
Anion Gap: 8 (ref 4–12)
Calcium: 9.3 mg/dL (ref 8.9–10.2)
Carbon Dioxide, Total: 30 mEq/L (ref 22–32)
Chloride: 91 mEq/L — ABNORMAL LOW (ref 98–108)
Creatinine: 1.38 mg/dL — ABNORMAL HIGH (ref 0.51–1.18)
GFR, Calc, African American: >60 mL/min (ref >59)
GFR, Calc, European American: 52 mL/min — ABNORMAL LOW (ref >59)
Glucose: 151 mg/dL — ABNORMAL HIGH (ref 62–125)
Potassium: 3.6 mEq/L (ref 3.6–5.2)
Sodium: 129 mEq/L — ABNORMAL LOW (ref 135–145)
Urea Nitrogen: 42 mg/dL — ABNORMAL HIGH (ref 8–21)

## 2014-03-27 LAB — PROTHROMBIN & PTT
Partial Thromboplastin Time: 30 s (ref 22–35)
Prothrombin INR: 1.4 — ABNORMAL HIGH (ref 0.8–1.3)
Prothrombin Time Patient: 16.2 s — ABNORMAL HIGH (ref 10.7–15.6)

## 2014-03-27 LAB — MAGNESIUM: Magnesium: 2.2 mg/dL (ref 1.8–2.4)

## 2014-03-27 LAB — AMMONIA, PLASMA: Ammonia, Plasma: 46 ug/dL (ref 0–65)

## 2014-03-27 LAB — B_TYPE NATRIURETIC PEPTIDE: B_Type Natriuretic Peptide: 1671 pg/mL — ABNORMAL HIGH (ref <101)

## 2014-03-27 LAB — R/O MRSA

## 2014-03-27 LAB — PHOSPHATE: Phosphate: 3 mg/dL (ref 2.5–4.5)

## 2014-03-28 ENCOUNTER — Other Ambulatory Visit (HOSPITAL_COMMUNITY): Admit: 2014-03-28 | Payer: Self-pay

## 2014-03-28 DIAGNOSIS — K8020 Calculus of gallbladder without cholecystitis without obstruction: Secondary | ICD-10-CM

## 2014-03-28 DIAGNOSIS — I4510 Unspecified right bundle-branch block: Secondary | ICD-10-CM

## 2014-03-28 LAB — BASIC METABOLIC PANEL
Anion Gap: 10 (ref 4–12)
Anion Gap: 10 (ref 4–12)
Calcium: 9.5 mg/dL (ref 8.9–10.2)
Calcium: 9.6 mg/dL (ref 8.9–10.2)
Carbon Dioxide, Total: 29 mEq/L (ref 22–32)
Carbon Dioxide, Total: 28 mEq/L (ref 22–32)
Chloride: 89 mEq/L — ABNORMAL LOW (ref 98–108)
Chloride: 89 mEq/L — ABNORMAL LOW (ref 98–108)
Creatinine: 1.53 mg/dL — ABNORMAL HIGH (ref 0.51–1.18)
Creatinine: 1.61 mg/dL — ABNORMAL HIGH (ref 0.51–1.18)
GFR, Calc, African American: 56 mL/min — ABNORMAL LOW (ref >59)
GFR, Calc, African American: 53 mL/min — ABNORMAL LOW (ref >59)
GFR, Calc, European American: 46 mL/min — ABNORMAL LOW (ref >59)
GFR, Calc, European American: 44 mL/min — ABNORMAL LOW (ref >59)
Glucose: 157 mg/dL — ABNORMAL HIGH (ref 62–125)
Glucose: 156 mg/dL — ABNORMAL HIGH (ref 62–125)
Potassium: 3.4 mEq/L — ABNORMAL LOW (ref 3.6–5.2)
Potassium: 4.3 mEq/L (ref 3.6–5.2)
Sodium: 128 mEq/L — ABNORMAL LOW (ref 135–145)
Sodium: 127 mEq/L — ABNORMAL LOW (ref 135–145)
Urea Nitrogen: 43 mg/dL — ABNORMAL HIGH (ref 8–21)
Urea Nitrogen: 44 mg/dL — ABNORMAL HIGH (ref 8–21)

## 2014-03-28 LAB — HEPATIC FUNCTION PANEL
ALT (GPT): 22 U/L (ref 10–48)
AST (GOT): 25 U/L (ref 9–38)
Albumin: 4 g/dL (ref 3.5–5.2)
Alkaline Phosphatase (Total): 146 U/L (ref 37–159)
Bilirubin (Direct): 1.5 mg/dL — ABNORMAL HIGH (ref 0.0–0.3)
Bilirubin (Total): 3.2 mg/dL — ABNORMAL HIGH (ref 0.2–1.3)
Protein (Total): 6.6 g/dL (ref 6.0–8.2)

## 2014-03-28 LAB — URINE C/S: Colony Count: 500

## 2014-03-28 LAB — CBC (HEMOGRAM)
Hematocrit: 31 % — ABNORMAL LOW (ref 38–50)
Hemoglobin: 10.2 g/dL — ABNORMAL LOW (ref 13.0–18.0)
MCH: 30.9 pg (ref 27.3–33.6)
MCHC: 32.5 g/dL (ref 32.2–36.5)
MCV: 95 fL (ref 81–98)
Platelet Count: 107 10*3/uL — ABNORMAL LOW (ref 150–400)
RBC: 3.3 mil/uL — ABNORMAL LOW (ref 4.40–5.60)
RDW-CV: 18.5 % — ABNORMAL HIGH (ref 11.6–14.4)
WBC: 8.21 10*3/uL (ref 4.3–10.0)

## 2014-03-28 LAB — PROTHROMBIN & PTT
Partial Thromboplastin Time: 30 s (ref 22–35)
Prothrombin INR: 1.3 (ref 0.8–1.3)
Prothrombin Time Patient: 15.6 s (ref 10.7–15.6)

## 2014-03-28 LAB — MAGNESIUM: Magnesium: 2.1 mg/dL (ref 1.8–2.4)

## 2014-03-28 LAB — PHOSPHATE: Phosphate: 3.2 mg/dL (ref 2.5–4.5)

## 2014-03-28 LAB — B_TYPE NATRIURETIC PEPTIDE: B_Type Natriuretic Peptide: 2036 pg/mL — ABNORMAL HIGH (ref <101)

## 2014-03-29 ENCOUNTER — Other Ambulatory Visit (HOSPITAL_COMMUNITY): Payer: Self-pay

## 2014-03-29 ENCOUNTER — Ambulatory Visit (HOSPITAL_COMMUNITY)

## 2014-03-29 ENCOUNTER — Other Ambulatory Visit (HOSPITAL_BASED_OUTPATIENT_CLINIC_OR_DEPARTMENT_OTHER): Payer: Self-pay

## 2014-03-29 DIAGNOSIS — I5022 Chronic systolic (congestive) heart failure: Secondary | ICD-10-CM

## 2014-03-29 LAB — BASIC METABOLIC PANEL
Anion Gap: 10 (ref 4–12)
Calcium: 9.5 mg/dL (ref 8.9–10.2)
Carbon Dioxide, Total: 27 mEq/L (ref 22–32)
Chloride: 90 mEq/L — ABNORMAL LOW (ref 98–108)
Creatinine: 1.69 mg/dL — ABNORMAL HIGH (ref 0.51–1.18)
GFR, Calc, African American: 50 mL/min — ABNORMAL LOW (ref >59)
GFR, Calc, European American: 41 mL/min — ABNORMAL LOW (ref >59)
Glucose: 112 mg/dL (ref 62–125)
Potassium: 4.4 mEq/L (ref 3.6–5.2)
Sodium: 127 mEq/L — ABNORMAL LOW (ref 135–145)
Urea Nitrogen: 43 mg/dL — ABNORMAL HIGH (ref 8–21)

## 2014-03-29 LAB — HEPATIC FUNCTION PANEL
ALT (GPT): 21 U/L (ref 10–48)
AST (GOT): 25 U/L (ref 9–38)
Albumin: 4.1 g/dL (ref 3.5–5.2)
Alkaline Phosphatase (Total): 141 U/L (ref 37–159)
Bilirubin (Direct): 1.7 mg/dL — ABNORMAL HIGH (ref 0.0–0.3)
Bilirubin (Total): 3.5 mg/dL — ABNORMAL HIGH (ref 0.2–1.3)
Protein (Total): 6.8 g/dL (ref 6.0–8.2)

## 2014-03-29 LAB — COOX PANEL, VENOUS
Carboxyhemoglobin, VEN: 1.5 %
Carboxyhemoglobin, VEN: 1.5 %
Methemoglobin, VEN: 1.2 % (ref <3.0)
Methemoglobin, VEN: 0.6 % (ref <3.0)
O2 Content, VEN: 5.8 VOL%
O2 Content, VEN: 7.9 VOL%
O2 Saturation (Frac.), VEN: 38 %
O2 Saturation (Frac.), VEN: 53 %
O2 Saturation (Func.), VEN: 39 %
O2 Saturation (Func.), VEN: 54 %
Total Hemoglobin, VEN: 10.8 g/dL — ABNORMAL LOW (ref 13.0–18.0)
Total Hemoglobin, VEN: 10.7 g/dL — ABNORMAL LOW (ref 13.0–18.0)

## 2014-03-29 LAB — CALCIUM (IONIZED), WB: Calcium (Ionized): 1.12 mmol/L — ABNORMAL LOW (ref 1.18–1.38)

## 2014-03-29 LAB — CBC (HEMOGRAM)
Hematocrit: 33 % — ABNORMAL LOW (ref 38–50)
Hemoglobin: 10.6 g/dL — ABNORMAL LOW (ref 13.0–18.0)
MCH: 31 pg (ref 27.3–33.6)
MCHC: 31.9 g/dL — ABNORMAL LOW (ref 32.2–36.5)
MCV: 97 fL (ref 81–98)
Platelet Count: 134 10*3/uL — ABNORMAL LOW (ref 150–400)
RBC: 3.42 mil/uL — ABNORMAL LOW (ref 4.40–5.60)
RDW-CV: 18.7 % — ABNORMAL HIGH (ref 11.6–14.4)
WBC: 9.82 10*3/uL (ref 4.3–10.0)

## 2014-03-29 LAB — URINALYSIS WITH REFLEX CULTURE
Bacteria, URN: NONE SEEN
Bilirubin (Qual), URN: NEGATIVE
Cast_Hyaline, URN: <1 /LPF — AB
Epith Cells_Renal/Trans,URN: NEGATIVE /HPF
Epith Cells_Squamous, URN: NEGATIVE /LPF
Glucose Qual, URN: NEGATIVE mg/dL
Ketones, URN: NEGATIVE mg/dL
Leukocyte Esterase, URN: NEGATIVE
Nitrite, URN: NEGATIVE
Occult Blood, URN: NEGATIVE
Protein (Alb Semiquant), URN: NEGATIVE mg/dL
RBC, URN: NEGATIVE /HPF
Specific Gravity, URN: 1.008 g/mL (ref 1.002–1.027)
Urobilinogen, URN: 2 EHRLICH UNITS — AB
WBC, URN: NEGATIVE /HPF
pH, URN: 6 (ref 5.0–8.0)

## 2014-03-29 LAB — PATHOLOGY, SURGICAL

## 2014-03-29 LAB — PROTHROMBIN & PTT
Partial Thromboplastin Time: 28 s (ref 22–35)
Prothrombin INR: 1.4 — ABNORMAL HIGH (ref 0.8–1.3)
Prothrombin Time Patient: 16.3 s — ABNORMAL HIGH (ref 10.7–15.6)

## 2014-03-30 ENCOUNTER — Ambulatory Visit: Attending: Nurse Practitioner

## 2014-03-30 ENCOUNTER — Ambulatory Visit (HOSPITAL_BASED_OUTPATIENT_CLINIC_OR_DEPARTMENT_OTHER)

## 2014-03-30 DIAGNOSIS — Z01818 Encounter for other preprocedural examination: Secondary | ICD-10-CM | POA: Insufficient documentation

## 2014-03-30 DIAGNOSIS — I509 Heart failure, unspecified: Secondary | ICD-10-CM | POA: Insufficient documentation

## 2014-03-30 DIAGNOSIS — I6523 Occlusion and stenosis of bilateral carotid arteries: Secondary | ICD-10-CM | POA: Insufficient documentation

## 2014-03-30 LAB — CBC, DIFF
% Basophils: 0 %
% Eosinophils: 2 %
% Immature Granulocytes: 1 %
% Lymphocytes: 13 %
% Monocytes: 12 %
% Neutrophils: 72 %
Absolute Eosinophil Count: 0.16 10*3/uL (ref 0.00–0.50)
Absolute Lymphocyte Count: 1.08 10*3/uL (ref 1.00–4.80)
Basophils: 0.02 10*3/uL (ref 0.00–0.20)
Hematocrit: 30 % — ABNORMAL LOW (ref 38–50)
Hemoglobin: 9.7 g/dL — ABNORMAL LOW (ref 13.0–18.0)
Immature Granulocytes: 0.08 10*3/uL — ABNORMAL HIGH (ref 0.00–0.05)
MCH: 31.4 pg (ref 27.3–33.6)
MCHC: 32.9 g/dL (ref 32.2–36.5)
MCV: 96 fL (ref 81–98)
Monocytes: 0.98 10*3/uL — ABNORMAL HIGH (ref 0.00–0.80)
Neutrophils: 6.04 10*3/uL (ref 1.80–7.00)
Platelet Count: 126 10*3/uL — ABNORMAL LOW (ref 150–400)
RBC: 3.09 mil/uL — ABNORMAL LOW (ref 4.40–5.60)
RDW-CV: 18.7 % — ABNORMAL HIGH (ref 11.6–14.4)
WBC: 8.36 10*3/uL (ref 4.3–10.0)

## 2014-03-30 LAB — HEPATIC FUNCTION PANEL
ALT (GPT): 20 U/L (ref 10–48)
ALT (GPT): 20 U/L (ref 10–48)
AST (GOT): 25 U/L (ref 9–38)
AST (GOT): 25 U/L (ref 9–38)
Albumin: 3.9 g/dL (ref 3.5–5.2)
Albumin: 3.9 g/dL (ref 3.5–5.2)
Alkaline Phosphatase (Total): 133 U/L (ref 37–159)
Alkaline Phosphatase (Total): 138 U/L (ref 37–159)
Bilirubin (Direct): 1.7 mg/dL — ABNORMAL HIGH (ref 0.0–0.3)
Bilirubin (Direct): 1.6 mg/dL — ABNORMAL HIGH (ref 0.0–0.3)
Bilirubin (Total): 3.3 mg/dL — ABNORMAL HIGH (ref 0.2–1.3)
Bilirubin (Total): 3 mg/dL — ABNORMAL HIGH (ref 0.2–1.3)
Protein (Total): 6.4 g/dL (ref 6.0–8.2)
Protein (Total): 6.2 g/dL (ref 6.0–8.2)

## 2014-03-30 LAB — LAB ADD ON ORDER

## 2014-03-30 LAB — BASIC METABOLIC PANEL
Anion Gap: 9 (ref 4–12)
Calcium: 9.2 mg/dL (ref 8.9–10.2)
Carbon Dioxide, Total: 27 mEq/L (ref 22–32)
Chloride: 87 mEq/L — ABNORMAL LOW (ref 98–108)
Creatinine: 1.94 mg/dL — ABNORMAL HIGH (ref 0.51–1.18)
GFR, Calc, African American: 43 mL/min — ABNORMAL LOW (ref >59)
GFR, Calc, European American: 35 mL/min — ABNORMAL LOW (ref >59)
Glucose: 142 mg/dL — ABNORMAL HIGH (ref 62–125)
Potassium: 4 mEq/L (ref 3.6–5.2)
Sodium: 123 mEq/L — ABNORMAL LOW (ref 135–145)
Urea Nitrogen: 43 mg/dL — ABNORMAL HIGH (ref 8–21)

## 2014-03-30 LAB — LIPID PANEL
Cholesterol (LDL): 37 mg/dL (ref <130)
Cholesterol/HDL Ratio: 2.3
HDL Cholesterol: 38 mg/dL — ABNORMAL LOW (ref >39)
Non-HDL Cholesterol: 51 mg/dL (ref 0–159)
Total Cholesterol: 89 mg/dL (ref <200)
Triglyceride: 70 mg/dL (ref <150)

## 2014-03-30 LAB — IRON BINDING CAPACITY (W/IRON, TRANSFERRIN & TRANSF SAT)
Iron, SRM: 48 ug/dL (ref 31–171)
Total Iron Binding Capacity: 372 ug/dL (ref 250–460)
Transferrin Saturation: 13 % — ABNORMAL LOW (ref 15–50)
Transferrin: 266 mg/dL (ref 180–329)

## 2014-03-30 LAB — CBC (HEMOGRAM)
Hematocrit: 30 % — ABNORMAL LOW (ref 38–50)
Hemoglobin: 9.8 g/dL — ABNORMAL LOW (ref 13.0–18.0)
MCH: 31.3 pg (ref 27.3–33.6)
MCHC: 32.9 g/dL (ref 32.2–36.5)
MCV: 95 fL (ref 81–98)
Platelet Count: 128 10*3/uL — ABNORMAL LOW (ref 150–400)
RBC: 3.13 mil/uL — ABNORMAL LOW (ref 4.40–5.60)
RDW-CV: 18.8 % — ABNORMAL HIGH (ref 11.6–14.4)
WBC: 8.23 10*3/uL (ref 4.3–10.0)

## 2014-03-30 LAB — VITAMIN D (25 HYDROXY)
Vit D (25_Hydroxy) Total: 28 ng/mL (ref 20.1–50.0)
Vitamin D2 (25_Hydroxy): <1 ng/mL
Vitamin D3 (25_Hydroxy): 28 ng/mL

## 2014-03-30 LAB — CK, CREATINE KINASE, TOTAL ACTIVITY: Creatine Kinase Total Activity: 79 U/L (ref 62–325)

## 2014-03-30 LAB — T3 (FREE): T3 (Free): 2.4 pg/mL (ref 2.3–3.9)

## 2014-03-30 LAB — COOXIMETRY PANEL, MIXED VEN
Carboxyhemoglobin, Mixed VEN: 1.4 %
Carboxyhemoglobin, Mixed VEN: 1.2 %
Hemoglobin, MIXED VEN: 10.2 g/dL — ABNORMAL LOW (ref 13.0–18.0)
Hemoglobin, MIXED VEN: 10.1 g/dL — ABNORMAL LOW (ref 13.0–18.0)
Methemoglobin, Mixed VEN: 0.6 % (ref <3.0)
Methemoglobin, Mixed VEN: 0.6 % (ref <3.0)
O2 Content, Mixed VEN: 6.7 VOL%
O2 Content, Mixed VEN: 5.8 VOL%
O2 Sat (Frac.), Mixed VEN: 47 %
O2 Sat (Frac.), Mixed VEN: 41 %
O2 Sat (Func.), Mixed VEN: 48 %
O2 Sat (Func.), Mixed VEN: 41 %

## 2014-03-30 LAB — GAMMA GLUTAMYL TRANSFERASE: Gamma Glutamyl Transferase: 311 U/L — ABNORMAL HIGH (ref 0–55)

## 2014-03-30 LAB — CALCIUM (IONIZED), WB: Calcium (Ionized): 1.11 mmol/L — ABNORMAL LOW (ref 1.18–1.38)

## 2014-03-30 LAB — MAGNESIUM
Magnesium: 2.2 mg/dL (ref 1.8–2.4)
Magnesium: 2.3 mg/dL (ref 1.8–2.4)

## 2014-03-30 LAB — THYROID STIMULATING HORMONE: Thyroid Stimulating Hormone: 3.462 u[IU]/mL (ref 0.400–5.000)

## 2014-03-30 LAB — PROTHROMBIN TIME
Prothrombin INR: 1.4 — ABNORMAL HIGH (ref 0.8–1.3)
Prothrombin INR: 1.4 — ABNORMAL HIGH (ref 0.8–1.3)
Prothrombin Time Patient: 16.3 s — ABNORMAL HIGH (ref 10.7–15.6)
Prothrombin Time Patient: 16.8 s — ABNORMAL HIGH (ref 10.7–15.6)

## 2014-03-30 LAB — T3: Triiodothyronine (T3): 49 ng/dL — ABNORMAL LOW (ref 73–178)

## 2014-03-30 LAB — LACTATE DEHYDROGENASE: Lactate Dehydrogenase: 283 U/L — ABNORMAL HIGH (ref <210)

## 2014-03-30 LAB — TRANSTHYRETIN (PRE-ALBUMIN): Transthyretin (Pre_Albumin): 14.3 mg/dL — ABNORMAL LOW (ref 19.0–45.0)

## 2014-03-30 LAB — URIC ACID, SERUM: Uric Acid: 7.8 mg/dL — ABNORMAL HIGH (ref 3.9–7.6)

## 2014-03-30 LAB — ABO, RH (SENDOUT)

## 2014-03-30 LAB — PSA, DIAGNOSTIC/MONITORING: PSA, Diagnostic/Monitoring: 0.11 ng/mL (ref 0.00–4.00)

## 2014-03-30 LAB — T4, FREE: Thyroxine (Free): 1.4 ng/dL — ABNORMAL HIGH (ref 0.6–1.2)

## 2014-03-30 LAB — PHOSPHATE: Phosphate: 3.4 mg/dL (ref 2.5–4.5)

## 2014-03-30 LAB — AMYLASE: Amylase Total: 33 U/L (ref 27–106)

## 2014-03-30 LAB — HLA TESTING (SENDOUT)

## 2014-03-30 LAB — LIPASE: Lipase: 39 U/L (ref <70)

## 2014-03-30 LAB — FERRITIN: Ferritin: 198 ng/mL (ref 20–230)

## 2014-03-30 LAB — SED RATE: Erythrocyte Sedimentation Rate: 14 mm/hr (ref 0–15)

## 2014-03-30 LAB — THYROXINE (T4): Thyroxine (T4): 7.8 ug/dL (ref 4.8–10.8)

## 2014-03-31 ENCOUNTER — Other Ambulatory Visit (HOSPITAL_BASED_OUTPATIENT_CLINIC_OR_DEPARTMENT_OTHER): Payer: Self-pay

## 2014-03-31 DIAGNOSIS — F05 Delirium due to known physiological condition: Secondary | ICD-10-CM

## 2014-03-31 DIAGNOSIS — Z452 Encounter for adjustment and management of vascular access device: Secondary | ICD-10-CM

## 2014-03-31 DIAGNOSIS — I5020 Unspecified systolic (congestive) heart failure: Secondary | ICD-10-CM

## 2014-03-31 DIAGNOSIS — R931 Abnormal findings on diagnostic imaging of heart and coronary circulation: Secondary | ICD-10-CM

## 2014-03-31 DIAGNOSIS — F4322 Adjustment disorder with anxiety: Secondary | ICD-10-CM

## 2014-03-31 LAB — PROTEIN ELECTROPHORESIS
Albumin: 3.9 g/dL (ref 3.5–4.9)
Alpha 1: 0.3 g/dL (ref 0.1–0.3)
Alpha 2: 0.6 g/dL (ref 0.3–0.8)
Beta: 0.7 g/dL (ref 0.6–1.0)
Electrophoresis Interp:: NORMAL
Gamma: 0.7 g/dL (ref 0.4–1.4)
Protein (Total): 6.3 g/dL (ref 6.0–8.2)

## 2014-03-31 LAB — ANTI TOXOPLASMA, IGG
Anti Toxoplasma, IgG Interp: NONREACTIVE
Anti Toxoplasma, IgG Result: <3.2 IU/mL (ref 0.0–7.4)

## 2014-03-31 LAB — CBC (HEMOGRAM)
Hematocrit: 29 % — ABNORMAL LOW (ref 38–50)
Hemoglobin: 9.5 g/dL — ABNORMAL LOW (ref 13.0–18.0)
MCH: 31.1 pg (ref 27.3–33.6)
MCHC: 32.8 g/dL (ref 32.2–36.5)
MCV: 95 fL (ref 81–98)
Platelet Count: 123 10*3/uL — ABNORMAL LOW (ref 150–400)
RBC: 3.05 mil/uL — ABNORMAL LOW (ref 4.40–5.60)
RDW-CV: 18.6 % — ABNORMAL HIGH (ref 11.6–14.4)
WBC: 9.3 10*3/uL (ref 4.3–10.0)

## 2014-03-31 LAB — SEROLOGIC SYPHILIS PANEL, SRM
Syphilis Igg Ab Result: NEGATIVE
Syphilis Screening Status: NEGATIVE

## 2014-03-31 LAB — COOX PANEL, VENOUS
Carboxyhemoglobin, VEN: 1.8 %
Methemoglobin, VEN: 0.5 % (ref <3.0)
O2 Content, VEN: 8.3 VOL%
O2 Saturation (Frac.), VEN: 64 %
O2 Saturation (Func.), VEN: 65 %
Total Hemoglobin, VEN: 9.2 g/dL — ABNORMAL LOW (ref 13.0–18.0)

## 2014-03-31 LAB — BASIC METABOLIC PANEL
Anion Gap: 10 (ref 4–12)
Calcium: 9.2 mg/dL (ref 8.9–10.2)
Carbon Dioxide, Total: 26 mEq/L (ref 22–32)
Chloride: 87 mEq/L — ABNORMAL LOW (ref 98–108)
Creatinine: 2.22 mg/dL — ABNORMAL HIGH (ref 0.51–1.18)
GFR, Calc, African American: 37 mL/min — ABNORMAL LOW (ref >59)
GFR, Calc, European American: 30 mL/min — ABNORMAL LOW (ref >59)
Glucose: 105 mg/dL (ref 62–125)
Potassium: 4.5 mEq/L (ref 3.6–5.2)
Sodium: 123 mEq/L — ABNORMAL LOW (ref 135–145)
Urea Nitrogen: 47 mg/dL — ABNORMAL HIGH (ref 8–21)

## 2014-03-31 LAB — RHEUMATOID FACTOR: Rheumatoid Factor: <13 IU/mL (ref <13)

## 2014-03-31 LAB — VZV IMMUNE STATUS BY IFA: Varicella Zoster Immune Status Result: >1:8 {titer}

## 2014-03-31 LAB — HEPATITIS A,B,& C PANEL
Hepatitis A Ab: REACTIVE
Hepatitis B Core Ab: NONREACTIVE
Hepatitis B Surf Antibody Intl Units: <3.1 IU
Hepatitis B Surface Ab: NONREACTIVE
Hepatitis B Surface Antigen w/Reflex: NONREACTIVE
Hepatitis C Antibody w/Rflx PCR: NONREACTIVE

## 2014-03-31 LAB — COOXIMETRY PANEL, MIXED VEN
Carboxyhemoglobin, Mixed VEN: 1.4 %
Hemoglobin, MIXED VEN: 10 g/dL — ABNORMAL LOW (ref 13.0–18.0)
Methemoglobin, Mixed VEN: 0.7 % (ref <3.0)
O2 Content, Mixed VEN: 7.2 VOL%
O2 Sat (Frac.), Mixed VEN: 51 %
O2 Sat (Func.), Mixed VEN: 52 %

## 2014-03-31 LAB — POTASSIUM, WBLD: Potassium: 4 mEq/L (ref 3.7–5.2)

## 2014-03-31 LAB — ABO, RH (SENDOUT)

## 2014-03-31 LAB — PROTHROMBIN TIME
Prothrombin INR: 1.5 — ABNORMAL HIGH (ref 0.8–1.3)
Prothrombin Time Patient: 17.2 s — ABNORMAL HIGH (ref 10.7–15.6)

## 2014-03-31 LAB — HEMOGLOBIN A1C, HPLC: Hemoglobin A1C: 6.2 % — ABNORMAL HIGH (ref 4.0–6.0)

## 2014-03-31 LAB — CALCIUM (IONIZED), WB: Calcium (Ionized): 1.12 mmol/L — ABNORMAL LOW (ref 1.18–1.38)

## 2014-03-31 LAB — HIV ANTIGEN AND ANTIBODY SCRN
HIV Antigen and Antibody Interpretation: NONREACTIVE
HIV Antigen and Antibody Result: NONREACTIVE

## 2014-04-01 DIAGNOSIS — J9811 Atelectasis: Secondary | ICD-10-CM

## 2014-04-01 DIAGNOSIS — I4891 Unspecified atrial fibrillation: Secondary | ICD-10-CM

## 2014-04-01 LAB — COOXIMETRY PANEL, MIXED VEN
Carboxyhemoglobin, Mixed VEN: 1.6 %
Hemoglobin, MIXED VEN: 8.9 g/dL — ABNORMAL LOW (ref 13.0–18.0)
Methemoglobin, Mixed VEN: 0.5 % (ref <3.0)
O2 Content, Mixed VEN: 7.3 VOL%
O2 Sat (Frac.), Mixed VEN: 58 %
O2 Sat (Func.), Mixed VEN: 59 %

## 2014-04-01 LAB — BASIC METABOLIC PANEL
Anion Gap: 10 (ref 4–12)
Anion Gap: 9 (ref 4–12)
Anion Gap: 9 (ref 4–12)
Calcium: 9 mg/dL (ref 8.9–10.2)
Calcium: 9.1 mg/dL (ref 8.9–10.2)
Calcium: 9.1 mg/dL (ref 8.9–10.2)
Carbon Dioxide, Total: 26 mEq/L (ref 22–32)
Carbon Dioxide, Total: 27 mEq/L (ref 22–32)
Carbon Dioxide, Total: 27 mEq/L (ref 22–32)
Chloride: 89 mEq/L — ABNORMAL LOW (ref 98–108)
Chloride: 90 mEq/L — ABNORMAL LOW (ref 98–108)
Chloride: 90 mEq/L — ABNORMAL LOW (ref 98–108)
Creatinine: 2.38 mg/dL — ABNORMAL HIGH (ref 0.51–1.18)
Creatinine: 2.33 mg/dL — ABNORMAL HIGH (ref 0.51–1.18)
Creatinine: 2.19 mg/dL — ABNORMAL HIGH (ref 0.51–1.18)
GFR, Calc, African American: 34 mL/min — ABNORMAL LOW (ref >59)
GFR, Calc, African American: 35 mL/min — ABNORMAL LOW (ref >59)
GFR, Calc, African American: 37 mL/min — ABNORMAL LOW (ref >59)
GFR, Calc, European American: 28 mL/min — ABNORMAL LOW (ref >59)
GFR, Calc, European American: 29 mL/min — ABNORMAL LOW (ref >59)
GFR, Calc, European American: 31 mL/min — ABNORMAL LOW (ref >59)
Glucose: 90 mg/dL (ref 62–125)
Glucose: 128 mg/dL — ABNORMAL HIGH (ref 62–125)
Glucose: 132 mg/dL — ABNORMAL HIGH (ref 62–125)
Potassium: 4.1 mEq/L (ref 3.6–5.2)
Potassium: 3.2 mEq/L — ABNORMAL LOW (ref 3.6–5.2)
Potassium: 3.6 mEq/L (ref 3.6–5.2)
Sodium: 125 mEq/L — ABNORMAL LOW (ref 135–145)
Sodium: 126 mEq/L — ABNORMAL LOW (ref 135–145)
Sodium: 126 mEq/L — ABNORMAL LOW (ref 135–145)
Urea Nitrogen: 48 mg/dL — ABNORMAL HIGH (ref 8–21)
Urea Nitrogen: 49 mg/dL — ABNORMAL HIGH (ref 8–21)
Urea Nitrogen: 46 mg/dL — ABNORMAL HIGH (ref 8–21)

## 2014-04-01 LAB — ANTI NUCLEAR ANTIBODIES: Antibodies to Nuclear Ags by IF (ANA): NEGATIVE

## 2014-04-01 LAB — PROTEIN/CREATININE RATIO, TIMED URINE
Creatinine/24H, Urine: 1114 mg/24 hr (ref 1000–2000)
Creatinine/Unit, Urine: 43 mg/dL
Protein (Total), Urine: 7 mg/dL
Protein/Creat Ratio Interval, Urine: 25 hr
Protein/Creat Ratio Volume, Urine: 2699 mL
Protein/Creatinine Ratio: 0.2 — ABNORMAL HIGH (ref <0.2)
Total Protein/24H, Urine: 0.18 g/24 hr — ABNORMAL HIGH (ref 0.05–0.08)

## 2014-04-01 LAB — LAB ADD ON ORDER

## 2014-04-01 LAB — PROTHROMBIN TIME
Prothrombin INR: 1.4 — ABNORMAL HIGH (ref 0.8–1.3)
Prothrombin Time Patient: 16.9 s — ABNORMAL HIGH (ref 10.7–15.6)

## 2014-04-01 LAB — PHOSPHATE: Phosphate: 3.8 mg/dL (ref 2.5–4.5)

## 2014-04-01 LAB — CALCIUM, (REFLEXIVE IONIZED)

## 2014-04-01 LAB — CBC (HEMOGRAM)
Hematocrit: 27 % — ABNORMAL LOW (ref 38–50)
Hemoglobin: 9 g/dL — ABNORMAL LOW (ref 13.0–18.0)
MCH: 31.6 pg (ref 27.3–33.6)
MCHC: 33.6 g/dL (ref 32.2–36.5)
MCV: 94 fL (ref 81–98)
Platelet Count: 138 10*3/uL — ABNORMAL LOW (ref 150–400)
RBC: 2.85 mil/uL — ABNORMAL LOW (ref 4.40–5.60)
RDW-CV: 18.6 % — ABNORMAL HIGH (ref 11.6–14.4)
WBC: 9 10*3/uL (ref 4.3–10.0)

## 2014-04-01 LAB — CREATININE CLEARANCE, URN
Creatinine Clearance Calc: 35 mL/min — ABNORMAL LOW (ref 75–120)
Creatinine Interval, URN: 25 hr
Creatinine Total Volume, URN: 2699 mL
Creatinine/24H, Urine: 1114 mg/24 hr (ref 1000–2000)
Creatinine/Unit, Urine: 43 mg/dL

## 2014-04-01 LAB — AMMONIA, PLASMA: Ammonia, Plasma: 52 ug/dL (ref 0–65)

## 2014-04-01 LAB — MAGNESIUM: Magnesium: 2.5 mg/dL — ABNORMAL HIGH (ref 1.8–2.4)

## 2014-04-01 LAB — CALCIUM (IONIZED), WB: Calcium (Ionized): 1.11 mmol/L — ABNORMAL LOW (ref 1.18–1.38)

## 2014-04-02 DIAGNOSIS — N179 Acute kidney failure, unspecified: Secondary | ICD-10-CM

## 2014-04-02 LAB — BASIC METABOLIC PANEL
Anion Gap: 9 (ref 4–12)
Calcium: 9.2 mg/dL (ref 8.9–10.2)
Carbon Dioxide, Total: 27 mEq/L (ref 22–32)
Chloride: 89 mEq/L — ABNORMAL LOW (ref 98–108)
Creatinine: 2.04 mg/dL — ABNORMAL HIGH (ref 0.51–1.18)
GFR, Calc, African American: 40 mL/min — ABNORMAL LOW (ref >59)
GFR, Calc, European American: 33 mL/min — ABNORMAL LOW (ref >59)
Glucose: 133 mg/dL — ABNORMAL HIGH (ref 62–125)
Potassium: 3.3 mEq/L — ABNORMAL LOW (ref 3.6–5.2)
Sodium: 125 mEq/L — ABNORMAL LOW (ref 135–145)
Urea Nitrogen: 45 mg/dL — ABNORMAL HIGH (ref 8–21)

## 2014-04-02 LAB — PROTHROMBIN & PTT
Partial Thromboplastin Time: 27 s (ref 22–35)
Prothrombin INR: 1.4 — ABNORMAL HIGH (ref 0.8–1.3)
Prothrombin Time Patient: 16.1 s — ABNORMAL HIGH (ref 10.7–15.6)

## 2014-04-02 LAB — CBC (HEMOGRAM)
Hematocrit: 28 % — ABNORMAL LOW (ref 38–50)
Hemoglobin: 9 g/dL — ABNORMAL LOW (ref 13.0–18.0)
MCH: 31.4 pg (ref 27.3–33.6)
MCHC: 32.5 g/dL (ref 32.2–36.5)
MCV: 97 fL (ref 81–98)
Platelet Count: 96 10*3/uL — ABNORMAL LOW (ref 150–400)
RBC: 2.87 mil/uL — ABNORMAL LOW (ref 4.40–5.60)
RDW-CV: 19 % — ABNORMAL HIGH (ref 11.6–14.4)
WBC: 8.78 10*3/uL (ref 4.3–10.0)

## 2014-04-02 LAB — COOXIMETRY PANEL, MIXED VEN
Carboxyhemoglobin, Mixed VEN: 1.6 %
Hemoglobin, MIXED VEN: 9.5 g/dL — ABNORMAL LOW (ref 13.0–18.0)
Methemoglobin, Mixed VEN: 0.4 % (ref <3.0)
O2 Content, Mixed VEN: 8.8 VOL%
O2 Sat (Frac.), Mixed VEN: 66 %
O2 Sat (Func.), Mixed VEN: 67 %

## 2014-04-02 LAB — POTASSIUM, WBLD
Potassium: 3.6 mEq/L — ABNORMAL LOW (ref 3.7–5.2)
Potassium: 3.8 mEq/L (ref 3.7–5.2)

## 2014-04-02 LAB — HEPATIC FUNCTION PANEL
ALT (GPT): 16 U/L (ref 10–48)
AST (GOT): 24 U/L (ref 9–38)
Albumin: 3.8 g/dL (ref 3.5–5.2)
Alkaline Phosphatase (Total): 146 U/L (ref 37–159)
Bilirubin (Direct): 1.4 mg/dL — ABNORMAL HIGH (ref 0.0–0.3)
Bilirubin (Total): 2.7 mg/dL — ABNORMAL HIGH (ref 0.2–1.3)
Protein (Total): 6.4 g/dL (ref 6.0–8.2)

## 2014-04-02 LAB — POTASSIUM, SERUM: Potassium: 3.7 mEq/L (ref 3.6–5.2)

## 2014-04-03 ENCOUNTER — Other Ambulatory Visit (HOSPITAL_COMMUNITY): Payer: Self-pay

## 2014-04-03 ENCOUNTER — Ambulatory Visit (HOSPITAL_BASED_OUTPATIENT_CLINIC_OR_DEPARTMENT_OTHER)

## 2014-04-03 DIAGNOSIS — I5021 Acute systolic (congestive) heart failure: Secondary | ICD-10-CM | POA: Insufficient documentation

## 2014-04-03 DIAGNOSIS — J810 Acute pulmonary edema: Secondary | ICD-10-CM

## 2014-04-03 DIAGNOSIS — I252 Old myocardial infarction: Secondary | ICD-10-CM | POA: Insufficient documentation

## 2014-04-03 LAB — CBC (HEMOGRAM)
Hematocrit: 26 % — ABNORMAL LOW (ref 38–50)
Hemoglobin: 8.8 g/dL — ABNORMAL LOW (ref 13.0–18.0)
MCH: 31.7 pg (ref 27.3–33.6)
MCHC: 33.8 g/dL (ref 32.2–36.5)
MCV: 94 fL (ref 81–98)
Platelet Count: 90 10*3/uL — ABNORMAL LOW (ref 150–400)
RBC: 2.78 mil/uL — ABNORMAL LOW (ref 4.40–5.60)
RDW-CV: 18.9 % — ABNORMAL HIGH (ref 11.6–14.4)
WBC: 10.73 10*3/uL — ABNORMAL HIGH (ref 4.3–10.0)

## 2014-04-03 LAB — PROTHROMBIN & PTT
Partial Thromboplastin Time: 35 s (ref 22–35)
Prothrombin INR: 1.4 — ABNORMAL HIGH (ref 0.8–1.3)
Prothrombin Time Patient: 16.9 s — ABNORMAL HIGH (ref 10.7–15.6)

## 2014-04-03 LAB — QUANTIFERON TB TEST
Quantiferon TB Ag Result: 0.035 IU gamma IF/mL
Quantiferon TB MIT Result: 0.103 IU gamma IF/mL
Quantiferon TB Nil Result: 0.025 IU gamma IF/mL

## 2014-04-03 LAB — CALCIUM (IONIZED), WB: Calcium (Ionized): 1.11 mmol/L — ABNORMAL LOW (ref 1.18–1.38)

## 2014-04-03 LAB — MUMPS IMMUNE STATUS BY EIA: Mumps Immune Status Result: POSITIVE

## 2014-04-03 LAB — PROTEIN ELECTROPHORESIS , URN
UELP Interval: 25 hr
UELP Volume: 2699 mL

## 2014-04-03 LAB — COOXIMETRY PANEL, MIXED VEN
Carboxyhemoglobin, Mixed VEN: 1.4 %
Hemoglobin, MIXED VEN: 8.5 g/dL — ABNORMAL LOW (ref 13.0–18.0)
Methemoglobin, Mixed VEN: 1 % (ref <3.0)
O2 Content, Mixed VEN: 6.5 VOL%
O2 Sat (Frac.), Mixed VEN: 54 %
O2 Sat (Func.), Mixed VEN: 56 %

## 2014-04-03 LAB — HEPATIC FUNCTION PANEL
ALT (GPT): 15 U/L (ref 10–48)
AST (GOT): 23 U/L (ref 9–38)
Albumin: 3.6 g/dL (ref 3.5–5.2)
Alkaline Phosphatase (Total): 137 U/L (ref 37–159)
Bilirubin (Direct): 1.2 mg/dL — ABNORMAL HIGH (ref 0.0–0.3)
Bilirubin (Total): 2.3 mg/dL — ABNORMAL HIGH (ref 0.2–1.3)
Protein (Total): 5.9 g/dL — ABNORMAL LOW (ref 6.0–8.2)

## 2014-04-03 LAB — CMV IMMUNE STATUS BY EIA: CMV Immune Status Result: NEGATIVE

## 2014-04-03 LAB — BASIC METABOLIC PANEL
Anion Gap: 9 (ref 4–12)
Calcium: 8.7 mg/dL — ABNORMAL LOW (ref 8.9–10.2)
Carbon Dioxide, Total: 26 mEq/L (ref 22–32)
Chloride: 91 mEq/L — ABNORMAL LOW (ref 98–108)
Creatinine: 1.94 mg/dL — ABNORMAL HIGH (ref 0.51–1.18)
GFR, Calc, African American: 43 mL/min — ABNORMAL LOW (ref >59)
GFR, Calc, European American: 35 mL/min — ABNORMAL LOW (ref >59)
Glucose: 127 mg/dL — ABNORMAL HIGH (ref 62–125)
Potassium: 3.7 mEq/L (ref 3.6–5.2)
Sodium: 126 mEq/L — ABNORMAL LOW (ref 135–145)
Urea Nitrogen: 41 mg/dL — ABNORMAL HIGH (ref 8–21)

## 2014-04-03 LAB — RUBEOLA IMMUNE STATUS BY EIA: Rubeola Immune Status Result: POSITIVE

## 2014-04-03 LAB — RUBELLA IMMUNE STATUS BY EIA
Rubella Antibody Level: >20 IU
Rubella Immune Status Result: POSITIVE

## 2014-04-03 LAB — POTASSIUM, WBLD: Potassium: 3.1 mEq/L — ABNORMAL LOW (ref 3.7–5.2)

## 2014-04-04 ENCOUNTER — Ambulatory Visit (HOSPITAL_BASED_OUTPATIENT_CLINIC_OR_DEPARTMENT_OTHER): Payer: Self-pay

## 2014-04-04 DIAGNOSIS — Z515 Encounter for palliative care: Secondary | ICD-10-CM

## 2014-04-04 DIAGNOSIS — R251 Tremor, unspecified: Secondary | ICD-10-CM

## 2014-04-04 LAB — HEPATIC FUNCTION PANEL
ALT (GPT): 14 U/L (ref 10–48)
AST (GOT): 25 U/L (ref 9–38)
Albumin: 3.6 g/dL (ref 3.5–5.2)
Alkaline Phosphatase (Total): 136 U/L (ref 37–159)
Bilirubin (Direct): 1.3 mg/dL — ABNORMAL HIGH (ref 0.0–0.3)
Bilirubin (Total): 2.4 mg/dL — ABNORMAL HIGH (ref 0.2–1.3)
Protein (Total): 6.1 g/dL (ref 6.0–8.2)

## 2014-04-04 LAB — BASIC METABOLIC PANEL
Anion Gap: 9 (ref 4–12)
Calcium: 8.9 mg/dL (ref 8.9–10.2)
Carbon Dioxide, Total: 27 mEq/L (ref 22–32)
Chloride: 92 mEq/L — ABNORMAL LOW (ref 98–108)
Creatinine: 1.67 mg/dL — ABNORMAL HIGH (ref 0.51–1.18)
GFR, Calc, African American: 51 mL/min — ABNORMAL LOW (ref >59)
GFR, Calc, European American: 42 mL/min — ABNORMAL LOW (ref >59)
Glucose: 109 mg/dL (ref 62–125)
Potassium: 3.2 mEq/L — ABNORMAL LOW (ref 3.6–5.2)
Sodium: 128 mEq/L — ABNORMAL LOW (ref 135–145)
Urea Nitrogen: 37 mg/dL — ABNORMAL HIGH (ref 8–21)

## 2014-04-04 LAB — COOXIMETRY PANEL, MIXED VEN
Carboxyhemoglobin, Mixed VEN: 1.5 %
Carboxyhemoglobin, Mixed VEN: 1.7 %
Carboxyhemoglobin, Mixed VEN: 1.4 %
Hemoglobin, MIXED VEN: 9.4 g/dL — ABNORMAL LOW (ref 13.0–18.0)
Hemoglobin, MIXED VEN: 9.7 g/dL — ABNORMAL LOW (ref 13.0–18.0)
Hemoglobin, MIXED VEN: 9.6 g/dL — ABNORMAL LOW (ref 13.0–18.0)
Methemoglobin, Mixed VEN: 0.7 % (ref <3.0)
Methemoglobin, Mixed VEN: 0.8 % (ref <3.0)
Methemoglobin, Mixed VEN: 1 % (ref <3.0)
O2 Content, Mixed VEN: 8.1 VOL%
O2 Content, Mixed VEN: 7.3 VOL%
O2 Content, Mixed VEN: 6.3 VOL%
O2 Sat (Frac.), Mixed VEN: 61 %
O2 Sat (Frac.), Mixed VEN: 53 %
O2 Sat (Frac.), Mixed VEN: 47 %
O2 Sat (Func.), Mixed VEN: 63 %
O2 Sat (Func.), Mixed VEN: 55 %
O2 Sat (Func.), Mixed VEN: 48 %

## 2014-04-04 LAB — CBC (HEMOGRAM)
Hematocrit: 27 % — ABNORMAL LOW (ref 38–50)
Hemoglobin: 8.9 g/dL — ABNORMAL LOW (ref 13.0–18.0)
MCH: 31.3 pg (ref 27.3–33.6)
MCHC: 32.8 g/dL (ref 32.2–36.5)
MCV: 95 fL (ref 81–98)
Platelet Count: 81 10*3/uL — ABNORMAL LOW (ref 150–400)
RBC: 2.84 mil/uL — ABNORMAL LOW (ref 4.40–5.60)
RDW-CV: 19 % — ABNORMAL HIGH (ref 11.6–14.4)
WBC: 9.96 10*3/uL (ref 4.3–10.0)

## 2014-04-04 LAB — LAB UNDEFINED ORCA/EPIC ORDER

## 2014-04-04 LAB — POTASSIUM, WBLD
Potassium: 3.6 mEq/L — ABNORMAL LOW (ref 3.7–5.2)
Potassium: 4 mEq/L (ref 3.7–5.2)

## 2014-04-04 LAB — PROTHROMBIN & PTT
Partial Thromboplastin Time: 34 s (ref 22–35)
Prothrombin INR: 1.3 (ref 0.8–1.3)
Prothrombin Time Patient: 15.8 s — ABNORMAL HIGH (ref 10.7–15.6)

## 2014-04-05 DIAGNOSIS — R410 Disorientation, unspecified: Secondary | ICD-10-CM

## 2014-04-05 DIAGNOSIS — N189 Chronic kidney disease, unspecified: Secondary | ICD-10-CM

## 2014-04-05 LAB — CBC (HEMOGRAM)
Hematocrit: 30 % — ABNORMAL LOW (ref 38–50)
Hemoglobin: 9.8 g/dL — ABNORMAL LOW (ref 13.0–18.0)
MCH: 31.5 pg (ref 27.3–33.6)
MCHC: 32.5 g/dL (ref 32.2–36.5)
MCV: 97 fL (ref 81–98)
Platelet Count: 82 10*3/uL — ABNORMAL LOW (ref 150–400)
RBC: 3.11 mil/uL — ABNORMAL LOW (ref 4.40–5.60)
RDW-CV: 18.8 % — ABNORMAL HIGH (ref 11.6–14.4)
WBC: 8.3 10*3/uL (ref 4.3–10.0)

## 2014-04-05 LAB — BASIC METABOLIC PANEL
Anion Gap: 7 (ref 4–12)
Calcium: 8.5 mg/dL — ABNORMAL LOW (ref 8.9–10.2)
Carbon Dioxide, Total: 27 mEq/L (ref 22–32)
Chloride: 94 mEq/L — ABNORMAL LOW (ref 98–108)
Creatinine: 1.51 mg/dL — ABNORMAL HIGH (ref 0.51–1.18)
GFR, Calc, African American: 57 mL/min — ABNORMAL LOW (ref >59)
GFR, Calc, European American: 47 mL/min — ABNORMAL LOW (ref >59)
Glucose: 108 mg/dL (ref 62–125)
Potassium: 3.7 mEq/L (ref 3.6–5.2)
Sodium: 128 mEq/L — ABNORMAL LOW (ref 135–145)
Urea Nitrogen: 32 mg/dL — ABNORMAL HIGH (ref 8–21)

## 2014-04-05 LAB — LAB ADD ON ORDER

## 2014-04-05 LAB — POTASSIUM, SERUM
Potassium: 3.9 mEq/L (ref 3.6–5.2)
Potassium: 3.6 mEq/L (ref 3.6–5.2)

## 2014-04-05 LAB — COOXIMETRY PANEL, MIXED VEN
Carboxyhemoglobin, Mixed VEN: 1.5 %
Hemoglobin, MIXED VEN: 9.9 g/dL — ABNORMAL LOW (ref 13.0–18.0)
Methemoglobin, Mixed VEN: 0.6 % (ref <3.0)
O2 Content, Mixed VEN: 9.7 VOL%
O2 Sat (Frac.), Mixed VEN: 70 %
O2 Sat (Func.), Mixed VEN: 72 %

## 2014-04-05 LAB — HEPATIC FUNCTION PANEL
ALT (GPT): 17 U/L (ref 10–48)
AST (GOT): 25 U/L (ref 9–38)
Albumin: 3.6 g/dL (ref 3.5–5.2)
Alkaline Phosphatase (Total): 119 U/L (ref 37–159)
Bilirubin (Direct): 1 mg/dL — ABNORMAL HIGH (ref 0.0–0.3)
Bilirubin (Total): 2.2 mg/dL — ABNORMAL HIGH (ref 0.2–1.3)
Protein (Total): 6 g/dL (ref 6.0–8.2)

## 2014-04-05 LAB — PARTIAL THROMBOPLASTIN TIME
Partial Thromboplastin Time: 74 s — ABNORMAL HIGH (ref 22–35)
Partial Thromboplastin Time: 83 s — ABNORMAL HIGH (ref 22–35)

## 2014-04-05 LAB — PROTHROMBIN & PTT
Partial Thromboplastin Time: 33 s (ref 22–35)
Prothrombin INR: 1.3 (ref 0.8–1.3)
Prothrombin Time Patient: 15.6 s (ref 10.7–15.6)

## 2014-04-05 LAB — MAGNESIUM: Magnesium: 2.2 mg/dL (ref 1.8–2.4)

## 2014-04-06 DIAGNOSIS — R4182 Altered mental status, unspecified: Secondary | ICD-10-CM

## 2014-04-06 LAB — COOXIMETRY PANEL, MIXED VEN
Carboxyhemoglobin, Mixed VEN: 1.5 %
Carboxyhemoglobin, Mixed VEN: 1.5 %
Carboxyhemoglobin, Mixed VEN: 1.8 %
Hemoglobin, MIXED VEN: 9.7 g/dL — ABNORMAL LOW (ref 13.0–18.0)
Hemoglobin, MIXED VEN: 9.7 g/dL — ABNORMAL LOW (ref 13.0–18.0)
Hemoglobin, MIXED VEN: 9.1 g/dL — ABNORMAL LOW (ref 13.0–18.0)
Methemoglobin, Mixed VEN: 0.5 % (ref <3.0)
Methemoglobin, Mixed VEN: 0.7 % (ref <3.0)
Methemoglobin, Mixed VEN: 0.6 % (ref <3.0)
O2 Content, Mixed VEN: 9.3 VOL%
O2 Content, Mixed VEN: 6.6 VOL%
O2 Content, Mixed VEN: 7.2 VOL%
O2 Sat (Frac.), Mixed VEN: 68 %
O2 Sat (Frac.), Mixed VEN: 49 %
O2 Sat (Frac.), Mixed VEN: 56 %
O2 Sat (Func.), Mixed VEN: 70 %
O2 Sat (Func.), Mixed VEN: 50 %
O2 Sat (Func.), Mixed VEN: 58 %

## 2014-04-06 LAB — POTASSIUM, WBLD: Potassium: 3.7 mEq/L (ref 3.7–5.2)

## 2014-04-06 LAB — CBC (HEMOGRAM)
Hematocrit: 30 % — ABNORMAL LOW (ref 38–50)
Hemoglobin: 9.9 g/dL — ABNORMAL LOW (ref 13.0–18.0)
MCH: 31.8 pg (ref 27.3–33.6)
MCHC: 33 g/dL (ref 32.2–36.5)
MCV: 97 fL (ref 81–98)
Platelet Count: 84 10*3/uL — ABNORMAL LOW (ref 150–400)
RBC: 3.11 mil/uL — ABNORMAL LOW (ref 4.40–5.60)
RDW-CV: 18.7 % — ABNORMAL HIGH (ref 11.6–14.4)
WBC: 7.86 10*3/uL (ref 4.3–10.0)

## 2014-04-06 LAB — LAB ADD ON ORDER

## 2014-04-06 LAB — HEPATIC FUNCTION PANEL
ALT (GPT): 15 U/L (ref 10–48)
AST (GOT): 26 U/L (ref 9–38)
Albumin: 3.7 g/dL (ref 3.5–5.2)
Alkaline Phosphatase (Total): 147 U/L (ref 37–159)
Bilirubin (Direct): 1.2 mg/dL — ABNORMAL HIGH (ref 0.0–0.3)
Bilirubin (Total): 2.4 mg/dL — ABNORMAL HIGH (ref 0.2–1.3)
Protein (Total): 6.3 g/dL (ref 6.0–8.2)

## 2014-04-06 LAB — BASIC METABOLIC PANEL
Anion Gap: 11 (ref 4–12)
Calcium: 8.9 mg/dL (ref 8.9–10.2)
Carbon Dioxide, Total: 25 mEq/L (ref 22–32)
Chloride: 93 mEq/L — ABNORMAL LOW (ref 98–108)
Creatinine: 1.35 mg/dL — ABNORMAL HIGH (ref 0.51–1.18)
GFR, Calc, African American: >60 mL/min (ref >59)
GFR, Calc, European American: 54 mL/min — ABNORMAL LOW (ref >59)
Glucose: 97 mg/dL (ref 62–125)
Potassium: 3.1 mEq/L — ABNORMAL LOW (ref 3.6–5.2)
Sodium: 129 mEq/L — ABNORMAL LOW (ref 135–145)
Urea Nitrogen: 29 mg/dL — ABNORMAL HIGH (ref 8–21)

## 2014-04-06 LAB — PROTHROMBIN TIME
Prothrombin INR: 1.3 (ref 0.8–1.3)
Prothrombin Time Patient: 15.5 s (ref 10.7–15.6)

## 2014-04-06 LAB — PHOSPHATE: Phosphate: 3.5 mg/dL (ref 2.5–4.5)

## 2014-04-06 LAB — CALCIUM (IONIZED), WB: Calcium (Ionized): 1.1 mmol/L — ABNORMAL LOW (ref 1.18–1.38)

## 2014-04-06 LAB — HLA AB SPECIFICITY (SENDOUT)

## 2014-04-06 LAB — EBV SEROLOGY
EBV IgG Antibody: POSITIVE
EBV IgM Antibody: NEGATIVE
EBV Nuclear Antibody: POSITIVE

## 2014-04-06 LAB — PARTIAL THROMBOPLASTIN TIME
Partial Thromboplastin Time: 80 s — ABNORMAL HIGH (ref 22–35)
Partial Thromboplastin Time: 81 s — ABNORMAL HIGH (ref 22–35)
Partial Thromboplastin Time: 60 s — ABNORMAL HIGH (ref 22–35)

## 2014-04-06 LAB — POTASSIUM, SERUM
Potassium: 3.4 mEq/L — ABNORMAL LOW (ref 3.6–5.2)
Potassium: 3.8 mEq/L (ref 3.6–5.2)

## 2014-04-06 LAB — MAGNESIUM: Magnesium: 2 mg/dL (ref 1.8–2.4)

## 2014-04-06 LAB — R/O MRSA

## 2014-04-07 LAB — PARTIAL THROMBOPLASTIN TIME
Partial Thromboplastin Time: 54 s — ABNORMAL HIGH (ref 22–35)
Partial Thromboplastin Time: 71 s — ABNORMAL HIGH (ref 22–35)
Partial Thromboplastin Time: 73 s — ABNORMAL HIGH (ref 22–35)

## 2014-04-07 LAB — POTASSIUM, SERUM
Potassium: 3.4 mEq/L — ABNORMAL LOW (ref 3.6–5.2)
Potassium: 4.2 mEq/L (ref 3.6–5.2)

## 2014-04-07 LAB — PROTHROMBIN & PTT
Partial Thromboplastin Time: 108 s — ABNORMAL HIGH (ref 22–35)
Prothrombin INR: 1.3 (ref 0.8–1.3)
Prothrombin Time Patient: 15.2 s (ref 10.7–15.6)

## 2014-04-07 LAB — DIGOXIN: Digoxin: 0.7 ng/mL (ref 0.5–1.9)

## 2014-04-07 LAB — BASIC METABOLIC PANEL
Anion Gap: 8 (ref 4–12)
Anion Gap: 9 (ref 4–12)
Calcium: 8.8 mg/dL — ABNORMAL LOW (ref 8.9–10.2)
Calcium: 8.9 mg/dL (ref 8.9–10.2)
Carbon Dioxide, Total: 27 mEq/L (ref 22–32)
Carbon Dioxide, Total: 26 mEq/L (ref 22–32)
Chloride: 91 mEq/L — ABNORMAL LOW (ref 98–108)
Chloride: 93 mEq/L — ABNORMAL LOW (ref 98–108)
Creatinine: 1.64 mg/dL — ABNORMAL HIGH (ref 0.51–1.18)
Creatinine: 1.56 mg/dL — ABNORMAL HIGH (ref 0.51–1.18)
GFR, Calc, African American: 52 mL/min — ABNORMAL LOW (ref >59)
GFR, Calc, African American: 55 mL/min — ABNORMAL LOW (ref >59)
GFR, Calc, European American: 43 mL/min — ABNORMAL LOW (ref >59)
GFR, Calc, European American: 45 mL/min — ABNORMAL LOW (ref >59)
Glucose: 103 mg/dL (ref 62–125)
Glucose: 106 mg/dL (ref 62–125)
Potassium: 3.5 mEq/L — ABNORMAL LOW (ref 3.6–5.2)
Potassium: 3.7 mEq/L (ref 3.6–5.2)
Sodium: 126 mEq/L — ABNORMAL LOW (ref 135–145)
Sodium: 128 mEq/L — ABNORMAL LOW (ref 135–145)
Urea Nitrogen: 30 mg/dL — ABNORMAL HIGH (ref 8–21)
Urea Nitrogen: 30 mg/dL — ABNORMAL HIGH (ref 8–21)

## 2014-04-07 LAB — CBC (HEMOGRAM)
Hematocrit: 28 % — ABNORMAL LOW (ref 38–50)
Hemoglobin: 9.2 g/dL — ABNORMAL LOW (ref 13.0–18.0)
MCH: 31.7 pg (ref 27.3–33.6)
MCHC: 33.1 g/dL (ref 32.2–36.5)
MCV: 96 fL (ref 81–98)
Platelet Count: 92 10*3/uL — ABNORMAL LOW (ref 150–400)
RBC: 2.9 mil/uL — ABNORMAL LOW (ref 4.40–5.60)
RDW-CV: 18.7 % — ABNORMAL HIGH (ref 11.6–14.4)
WBC: 7.85 10*3/uL (ref 4.3–10.0)

## 2014-04-07 LAB — HERPES TYPE 1 & 2 SEROLOGY

## 2014-04-07 LAB — MICROBIOLOGY/SEROLOGY SENDOUT

## 2014-04-07 LAB — COOXIMETRY PANEL, MIXED VEN
Carboxyhemoglobin, Mixed VEN: 1.7 %
Hemoglobin, MIXED VEN: 9.3 g/dL — ABNORMAL LOW (ref 13.0–18.0)
Methemoglobin, Mixed VEN: 0.5 % (ref <3.0)
O2 Content, Mixed VEN: 8.3 VOL%
O2 Sat (Frac.), Mixed VEN: 63 %
O2 Sat (Func.), Mixed VEN: 65 %

## 2014-04-07 LAB — MAGNESIUM: Magnesium: 2 mg/dL (ref 1.8–2.4)

## 2014-04-08 DIAGNOSIS — I5022 Chronic systolic (congestive) heart failure: Secondary | ICD-10-CM

## 2014-04-08 LAB — BASIC METABOLIC PANEL
Anion Gap: 11 (ref 4–12)
Anion Gap: 10 (ref 4–12)
Calcium: 9 mg/dL (ref 8.9–10.2)
Calcium: 8.8 mg/dL — ABNORMAL LOW (ref 8.9–10.2)
Carbon Dioxide, Total: 25 mEq/L (ref 22–32)
Carbon Dioxide, Total: 24 mEq/L (ref 22–32)
Chloride: 91 mEq/L — ABNORMAL LOW (ref 98–108)
Chloride: 90 mEq/L — ABNORMAL LOW (ref 98–108)
Creatinine: 1.67 mg/dL — ABNORMAL HIGH (ref 0.51–1.18)
Creatinine: 1.7 mg/dL — ABNORMAL HIGH (ref 0.51–1.18)
GFR, Calc, African American: 51 mL/min — ABNORMAL LOW (ref >59)
GFR, Calc, African American: 50 mL/min — ABNORMAL LOW (ref >59)
GFR, Calc, European American: 42 mL/min — ABNORMAL LOW (ref >59)
GFR, Calc, European American: 41 mL/min — ABNORMAL LOW (ref >59)
Glucose: 105 mg/dL (ref 62–125)
Glucose: 107 mg/dL (ref 62–125)
Potassium: 3.5 mEq/L — ABNORMAL LOW (ref 3.6–5.2)
Potassium: 3.8 mEq/L (ref 3.6–5.2)
Sodium: 127 mEq/L — ABNORMAL LOW (ref 135–145)
Sodium: 124 mEq/L — ABNORMAL LOW (ref 135–145)
Urea Nitrogen: 31 mg/dL — ABNORMAL HIGH (ref 8–21)
Urea Nitrogen: 30 mg/dL — ABNORMAL HIGH (ref 8–21)

## 2014-04-08 LAB — MAGNESIUM
Magnesium: 2 mg/dL (ref 1.8–2.4)
Magnesium: 1.9 mg/dL (ref 1.8–2.4)

## 2014-04-08 LAB — COOXIMETRY PANEL, MIXED VEN
Carboxyhemoglobin, Mixed VEN: 1.9 %
Hemoglobin, MIXED VEN: 9.6 g/dL — ABNORMAL LOW (ref 13.0–18.0)
Methemoglobin, Mixed VEN: 0.5 % (ref <3.0)
O2 Content, Mixed VEN: 8.8 VOL%
O2 Sat (Frac.), Mixed VEN: 65 %
O2 Sat (Func.), Mixed VEN: 67 %

## 2014-04-08 LAB — PARTIAL THROMBOPLASTIN TIME: Partial Thromboplastin Time: 73 s — ABNORMAL HIGH (ref 22–35)

## 2014-04-08 LAB — CBC (HEMOGRAM)
Hematocrit: 30 % — ABNORMAL LOW (ref 38–50)
Hemoglobin: 9.7 g/dL — ABNORMAL LOW (ref 13.0–18.0)
MCH: 31.7 pg (ref 27.3–33.6)
MCHC: 32.8 g/dL (ref 32.2–36.5)
MCV: 97 fL (ref 81–98)
Platelet Count: 102 10*3/uL — ABNORMAL LOW (ref 150–400)
RBC: 3.06 mil/uL — ABNORMAL LOW (ref 4.40–5.60)
RDW-CV: 18.9 % — ABNORMAL HIGH (ref 11.6–14.4)
WBC: 8.07 10*3/uL (ref 4.3–10.0)

## 2014-04-08 LAB — PROTHROMBIN TIME
Prothrombin INR: 1.2 (ref 0.8–1.3)
Prothrombin Time Patient: 15.1 s (ref 10.7–15.6)

## 2014-04-08 LAB — LAB ADD ON ORDER

## 2014-04-08 LAB — PHOSPHATE: Phosphate: 3.7 mg/dL (ref 2.5–4.5)

## 2014-04-08 LAB — CALCIUM, (REFLEXIVE IONIZED)

## 2014-04-08 LAB — DIGOXIN: Digoxin: 0.6 ng/mL (ref 0.5–1.9)

## 2014-04-09 DIAGNOSIS — I2510 Atherosclerotic heart disease of native coronary artery without angina pectoris: Secondary | ICD-10-CM

## 2014-04-09 LAB — COOXIMETRY PANEL, MIXED VEN
Carboxyhemoglobin, Mixed VEN: 1.7 %
Hemoglobin, MIXED VEN: 10.2 g/dL — ABNORMAL LOW (ref 13.0–18.0)
Methemoglobin, Mixed VEN: 1.1 % (ref <3.0)
O2 Content, Mixed VEN: 11.5 VOL%
O2 Sat (Frac.), Mixed VEN: 80 %
O2 Sat (Func.), Mixed VEN: 83 %

## 2014-04-09 LAB — BASIC METABOLIC PANEL
Anion Gap: 9 (ref 4–12)
Anion Gap: 8 (ref 4–12)
Calcium: 9.3 mg/dL (ref 8.9–10.2)
Calcium: 9 mg/dL (ref 8.9–10.2)
Carbon Dioxide, Total: 26 mEq/L (ref 22–32)
Carbon Dioxide, Total: 26 mEq/L (ref 22–32)
Chloride: 89 mEq/L — ABNORMAL LOW (ref 98–108)
Chloride: 88 mEq/L — ABNORMAL LOW (ref 98–108)
Creatinine: 1.63 mg/dL — ABNORMAL HIGH (ref 0.51–1.18)
Creatinine: 1.6 mg/dL — ABNORMAL HIGH (ref 0.51–1.18)
GFR, Calc, African American: 52 mL/min — ABNORMAL LOW (ref >59)
GFR, Calc, African American: 53 mL/min — ABNORMAL LOW (ref >59)
GFR, Calc, European American: 43 mL/min — ABNORMAL LOW (ref >59)
GFR, Calc, European American: 44 mL/min — ABNORMAL LOW (ref >59)
Glucose: 100 mg/dL (ref 62–125)
Glucose: 104 mg/dL (ref 62–125)
Potassium: 4.2 mEq/L (ref 3.6–5.2)
Potassium: 4.4 mEq/L (ref 3.6–5.2)
Sodium: 124 mEq/L — ABNORMAL LOW (ref 135–145)
Sodium: 122 mEq/L — ABNORMAL LOW (ref 135–145)
Urea Nitrogen: 30 mg/dL — ABNORMAL HIGH (ref 8–21)
Urea Nitrogen: 30 mg/dL — ABNORMAL HIGH (ref 8–21)

## 2014-04-09 LAB — CBC (HEMOGRAM)
Hematocrit: 31 % — ABNORMAL LOW (ref 38–50)
Hemoglobin: 10.2 g/dL — ABNORMAL LOW (ref 13.0–18.0)
MCH: 32 pg (ref 27.3–33.6)
MCHC: 33.2 g/dL (ref 32.2–36.5)
MCV: 96 fL (ref 81–98)
Platelet Count: 139 10*3/uL — ABNORMAL LOW (ref 150–400)
RBC: 3.19 mil/uL — ABNORMAL LOW (ref 4.40–5.60)
RDW-CV: 18.7 % — ABNORMAL HIGH (ref 11.6–14.4)
WBC: 9 10*3/uL (ref 4.3–10.0)

## 2014-04-09 LAB — PARTIAL THROMBOPLASTIN TIME: Partial Thromboplastin Time: 69 s — ABNORMAL HIGH (ref 22–35)

## 2014-04-09 LAB — PHOSPHATE: Phosphate: 3.7 mg/dL (ref 2.5–4.5)

## 2014-04-09 LAB — PROTHROMBIN TIME
Prothrombin INR: 1.3 (ref 0.8–1.3)
Prothrombin Time Patient: 15.2 s (ref 10.7–15.6)

## 2014-04-09 LAB — DIGOXIN: Digoxin: 0.6 ng/mL (ref 0.5–1.9)

## 2014-04-09 LAB — MAGNESIUM: Magnesium: 1.9 mg/dL (ref 1.8–2.4)

## 2014-04-10 DIAGNOSIS — N183 Chronic kidney disease, stage 3 (moderate): Secondary | ICD-10-CM

## 2014-04-10 LAB — COOXIMETRY PANEL, MIXED VEN
Carboxyhemoglobin, Mixed VEN: 1.5 %
Hemoglobin, MIXED VEN: 9.8 g/dL — ABNORMAL LOW (ref 13.0–18.0)
Methemoglobin, Mixed VEN: 0.6 % (ref <3.0)
O2 Content, Mixed VEN: 8.3 VOL%
O2 Sat (Frac.), Mixed VEN: 60 %
O2 Sat (Func.), Mixed VEN: 61 %

## 2014-04-10 LAB — PROTHROMBIN TIME
Prothrombin INR: 1.3 (ref 0.8–1.3)
Prothrombin Time Patient: 15.2 s (ref 10.7–15.6)

## 2014-04-10 LAB — BASIC METABOLIC PANEL
Anion Gap: 10 (ref 4–12)
Anion Gap: 9 (ref 4–12)
Calcium: 8.9 mg/dL (ref 8.9–10.2)
Calcium: 8.7 mg/dL — ABNORMAL LOW (ref 8.9–10.2)
Carbon Dioxide, Total: 27 mEq/L (ref 22–32)
Carbon Dioxide, Total: 27 mEq/L (ref 22–32)
Chloride: 86 mEq/L — ABNORMAL LOW (ref 98–108)
Chloride: 87 mEq/L — ABNORMAL LOW (ref 98–108)
Creatinine: 1.64 mg/dL — ABNORMAL HIGH (ref 0.51–1.18)
Creatinine: 1.52 mg/dL — ABNORMAL HIGH (ref 0.51–1.18)
GFR, Calc, African American: 52 mL/min — ABNORMAL LOW (ref >59)
GFR, Calc, African American: 57 mL/min — ABNORMAL LOW (ref >59)
GFR, Calc, European American: 43 mL/min — ABNORMAL LOW (ref >59)
GFR, Calc, European American: 47 mL/min — ABNORMAL LOW (ref >59)
Glucose: 122 mg/dL (ref 62–125)
Glucose: 135 mg/dL — ABNORMAL HIGH (ref 62–125)
Potassium: 3.7 mEq/L (ref 3.6–5.2)
Potassium: 4 mEq/L (ref 3.6–5.2)
Sodium: 123 mEq/L — ABNORMAL LOW (ref 135–145)
Sodium: 123 mEq/L — ABNORMAL LOW (ref 135–145)
Urea Nitrogen: 32 mg/dL — ABNORMAL HIGH (ref 8–21)
Urea Nitrogen: 31 mg/dL — ABNORMAL HIGH (ref 8–21)

## 2014-04-10 LAB — CBC (HEMOGRAM)
Hematocrit: 30 % — ABNORMAL LOW (ref 38–50)
Hemoglobin: 9.8 g/dL — ABNORMAL LOW (ref 13.0–18.0)
MCH: 31.6 pg (ref 27.3–33.6)
MCHC: 33.1 g/dL (ref 32.2–36.5)
MCV: 96 fL (ref 81–98)
Platelet Count: 129 10*3/uL — ABNORMAL LOW (ref 150–400)
RBC: 3.1 mil/uL — ABNORMAL LOW (ref 4.40–5.60)
RDW-CV: 18.7 % — ABNORMAL HIGH (ref 11.6–14.4)
WBC: 9.39 10*3/uL (ref 4.3–10.0)

## 2014-04-10 LAB — POTASSIUM, WBLD: Potassium: 3.8 mEq/L (ref 3.7–5.2)

## 2014-04-10 LAB — HEPATIC FUNCTION PANEL
ALT (GPT): 20 U/L (ref 10–48)
AST (GOT): 29 U/L (ref 9–38)
Albumin: 3.6 g/dL (ref 3.5–5.2)
Alkaline Phosphatase (Total): 147 U/L (ref 37–159)
Bilirubin (Direct): 1 mg/dL — ABNORMAL HIGH (ref 0.0–0.3)
Bilirubin (Total): 1.9 mg/dL — ABNORMAL HIGH (ref 0.2–1.3)
Protein (Total): 6.4 g/dL (ref 6.0–8.2)

## 2014-04-10 LAB — GLUCOSE, WHOLE BLOOD: Glucose: 138 mg/dL — ABNORMAL HIGH (ref 62–125)

## 2014-04-10 LAB — PARTIAL THROMBOPLASTIN TIME: Partial Thromboplastin Time: 79 s — ABNORMAL HIGH (ref 22–35)

## 2014-04-11 LAB — LAB ADD ON ORDER

## 2014-04-11 LAB — BASIC METABOLIC PANEL
Anion Gap: 7 (ref 4–12)
Anion Gap: 8 (ref 4–12)
Anion Gap: 8 (ref 4–12)
Calcium: 8.7 mg/dL — ABNORMAL LOW (ref 8.9–10.2)
Calcium: 8.8 mg/dL — ABNORMAL LOW (ref 8.9–10.2)
Calcium: 8.4 mg/dL — ABNORMAL LOW (ref 8.9–10.2)
Carbon Dioxide, Total: 30 mEq/L (ref 22–32)
Carbon Dioxide, Total: 30 mEq/L (ref 22–32)
Carbon Dioxide, Total: 29 mEq/L (ref 22–32)
Chloride: 85 mEq/L — ABNORMAL LOW (ref 98–108)
Chloride: 87 mEq/L — ABNORMAL LOW (ref 98–108)
Chloride: 85 mEq/L — ABNORMAL LOW (ref 98–108)
Creatinine: 1.52 mg/dL — ABNORMAL HIGH (ref 0.51–1.18)
Creatinine: 1.45 mg/dL — ABNORMAL HIGH (ref 0.51–1.18)
Creatinine: 1.38 mg/dL — ABNORMAL HIGH (ref 0.51–1.18)
GFR, Calc, African American: 57 mL/min — ABNORMAL LOW (ref >59)
GFR, Calc, African American: 60 mL/min (ref >59)
GFR, Calc, African American: >60 mL/min (ref >59)
GFR, Calc, European American: 47 mL/min — ABNORMAL LOW (ref >59)
GFR, Calc, European American: 49 mL/min — ABNORMAL LOW (ref >59)
GFR, Calc, European American: 52 mL/min — ABNORMAL LOW (ref >59)
Glucose: 94 mg/dL (ref 62–125)
Glucose: 130 mg/dL — ABNORMAL HIGH (ref 62–125)
Glucose: 212 mg/dL — ABNORMAL HIGH (ref 62–125)
Potassium: 3.4 mEq/L — ABNORMAL LOW (ref 3.6–5.2)
Potassium: 4.2 mEq/L (ref 3.6–5.2)
Potassium: 3.5 mEq/L — ABNORMAL LOW (ref 3.6–5.2)
Sodium: 122 mEq/L — ABNORMAL LOW (ref 135–145)
Sodium: 125 mEq/L — ABNORMAL LOW (ref 135–145)
Sodium: 122 mEq/L — ABNORMAL LOW (ref 135–145)
Urea Nitrogen: 29 mg/dL — ABNORMAL HIGH (ref 8–21)
Urea Nitrogen: 29 mg/dL — ABNORMAL HIGH (ref 8–21)
Urea Nitrogen: 29 mg/dL — ABNORMAL HIGH (ref 8–21)

## 2014-04-11 LAB — CBC (HEMOGRAM)
Hematocrit: 28 % — ABNORMAL LOW (ref 38–50)
Hemoglobin: 9.3 g/dL — ABNORMAL LOW (ref 13.0–18.0)
MCH: 31.2 pg (ref 27.3–33.6)
MCHC: 32.9 g/dL (ref 32.2–36.5)
MCV: 95 fL (ref 81–98)
Platelet Count: 115 10*3/uL — ABNORMAL LOW (ref 150–400)
RBC: 2.98 mil/uL — ABNORMAL LOW (ref 4.40–5.60)
RDW-CV: 18.5 % — ABNORMAL HIGH (ref 11.6–14.4)
WBC: 9.08 10*3/uL (ref 4.3–10.0)

## 2014-04-11 LAB — PARTIAL THROMBOPLASTIN TIME
Partial Thromboplastin Time: 60 s — ABNORMAL HIGH (ref 22–35)
Partial Thromboplastin Time: 78 s — ABNORMAL HIGH (ref 22–35)

## 2014-04-11 LAB — COOXIMETRY PANEL, MIXED VEN
Carboxyhemoglobin, Mixed VEN: 1.7 %
Carboxyhemoglobin, Mixed VEN: 1.5 %
Hemoglobin, MIXED VEN: 9.9 g/dL — ABNORMAL LOW (ref 13.0–18.0)
Hemoglobin, MIXED VEN: 10.1 g/dL — ABNORMAL LOW (ref 13.0–18.0)
Methemoglobin, Mixed VEN: 0.5 % (ref <3.0)
Methemoglobin, Mixed VEN: 1.3 % (ref <3.0)
O2 Content, Mixed VEN: 7.7 VOL%
O2 Content, Mixed VEN: 9.1 VOL%
O2 Sat (Frac.), Mixed VEN: 55 %
O2 Sat (Frac.), Mixed VEN: 64 %
O2 Sat (Func.), Mixed VEN: 57 %
O2 Sat (Func.), Mixed VEN: 66 %

## 2014-04-11 LAB — PROTHROMBIN & PTT
Partial Thromboplastin Time: 88 s — ABNORMAL HIGH (ref 22–35)
Prothrombin INR: 1.3 (ref 0.8–1.3)
Prothrombin Time Patient: 15.6 s (ref 10.7–15.6)

## 2014-04-11 LAB — POTASSIUM, WBLD: Potassium: 3.6 mEq/L — ABNORMAL LOW (ref 3.7–5.2)

## 2014-04-11 LAB — MAGNESIUM: Magnesium: 2.1 mg/dL (ref 1.8–2.4)

## 2014-04-11 LAB — PHOSPHATE: Phosphate: 3.6 mg/dL (ref 2.5–4.5)

## 2014-04-12 DIAGNOSIS — K7290 Hepatic failure, unspecified without coma: Secondary | ICD-10-CM

## 2014-04-12 DIAGNOSIS — I5023 Acute on chronic systolic (congestive) heart failure: Secondary | ICD-10-CM

## 2014-04-12 LAB — CBC (HEMOGRAM)
Hematocrit: 29 % — ABNORMAL LOW (ref 38–50)
Hemoglobin: 9.8 g/dL — ABNORMAL LOW (ref 13.0–18.0)
MCH: 32 pg (ref 27.3–33.6)
MCHC: 33.8 g/dL (ref 32.2–36.5)
MCV: 95 fL (ref 81–98)
Platelet Count: 114 10*3/uL — ABNORMAL LOW (ref 150–400)
RBC: 3.06 mil/uL — ABNORMAL LOW (ref 4.40–5.60)
RDW-CV: 18.3 % — ABNORMAL HIGH (ref 11.6–14.4)
WBC: 9.88 10*3/uL (ref 4.3–10.0)

## 2014-04-12 LAB — BASIC METABOLIC PANEL
Anion Gap: 9 (ref 4–12)
Anion Gap: 9 (ref 4–12)
Calcium: 8.7 mg/dL — ABNORMAL LOW (ref 8.9–10.2)
Calcium: 8.8 mg/dL — ABNORMAL LOW (ref 8.9–10.2)
Carbon Dioxide, Total: 29 mEq/L (ref 22–32)
Carbon Dioxide, Total: 31 mEq/L (ref 22–32)
Chloride: 83 mEq/L — ABNORMAL LOW (ref 98–108)
Chloride: 84 mEq/L — ABNORMAL LOW (ref 98–108)
Creatinine: 1.31 mg/dL — ABNORMAL HIGH (ref 0.51–1.18)
Creatinine: 1.36 mg/dL — ABNORMAL HIGH (ref 0.51–1.18)
GFR, Calc, African American: >60 mL/min (ref >59)
GFR, Calc, African American: >60 mL/min (ref >59)
GFR, Calc, European American: 55 mL/min — ABNORMAL LOW (ref >59)
GFR, Calc, European American: 53 mL/min — ABNORMAL LOW (ref >59)
Glucose: 114 mg/dL (ref 62–125)
Glucose: 118 mg/dL (ref 62–125)
Potassium: 3.5 mEq/L — ABNORMAL LOW (ref 3.6–5.2)
Potassium: 3.6 mEq/L (ref 3.6–5.2)
Sodium: 121 mEq/L — ABNORMAL LOW (ref 135–145)
Sodium: 124 mEq/L — ABNORMAL LOW (ref 135–145)
Urea Nitrogen: 28 mg/dL — ABNORMAL HIGH (ref 8–21)
Urea Nitrogen: 27 mg/dL — ABNORMAL HIGH (ref 8–21)

## 2014-04-12 LAB — PROTHROMBIN & PTT
Partial Thromboplastin Time: 61 s — ABNORMAL HIGH (ref 22–35)
Prothrombin INR: 1.2 (ref 0.8–1.3)
Prothrombin Time Patient: 15 s (ref 10.7–15.6)

## 2014-04-12 LAB — LAB ADD ON ORDER

## 2014-04-12 LAB — COOXIMETRY PANEL, MIXED VEN
Carboxyhemoglobin, Mixed VEN: 1.4 %
Hemoglobin, MIXED VEN: 9.8 g/dL — ABNORMAL LOW (ref 13.0–18.0)
Methemoglobin, Mixed VEN: 1.1 % (ref <3.0)
O2 Content, Mixed VEN: 8.6 VOL%
O2 Sat (Frac.), Mixed VEN: 62 %
O2 Sat (Func.), Mixed VEN: 64 %

## 2014-04-12 LAB — SODIUM

## 2014-04-12 LAB — MAGNESIUM
Magnesium: 1.9 mg/dL (ref 1.8–2.4)
Magnesium: 2.5 mg/dL — ABNORMAL HIGH (ref 1.8–2.4)

## 2014-04-12 LAB — PHOSPHATE
Phosphate: 3 mg/dL (ref 2.5–4.5)
Phosphate: 3.1 mg/dL (ref 2.5–4.5)

## 2014-04-12 LAB — HLA AB SPECIFICITY (SENDOUT)

## 2014-04-12 LAB — HLA AB DETECTION (SENDOUT)

## 2014-04-13 ENCOUNTER — Encounter (HOSPITAL_BASED_OUTPATIENT_CLINIC_OR_DEPARTMENT_OTHER): Payer: Self-pay

## 2014-04-13 DIAGNOSIS — R918 Other nonspecific abnormal finding of lung field: Secondary | ICD-10-CM

## 2014-04-13 LAB — PARTIAL THROMBOPLASTIN TIME
Partial Thromboplastin Time: 70 s — ABNORMAL HIGH (ref 22–35)
Partial Thromboplastin Time: 34 s (ref 22–35)
Partial Thromboplastin Time: 68 s — ABNORMAL HIGH (ref 22–35)

## 2014-04-13 LAB — CALCIUM (IONIZED), WB: Calcium (Ionized): 1.1 mmol/L — ABNORMAL LOW (ref 1.18–1.38)

## 2014-04-13 LAB — HEPATIC FUNCTION PANEL
ALT (GPT): 18 U/L (ref 10–48)
AST (GOT): 22 U/L (ref 9–38)
Albumin: 3.9 g/dL (ref 3.5–5.2)
Alkaline Phosphatase (Total): 119 U/L (ref 37–159)
Bilirubin (Direct): 0.9 mg/dL — ABNORMAL HIGH (ref 0.0–0.3)
Bilirubin (Total): 1.8 mg/dL — ABNORMAL HIGH (ref 0.2–1.3)
Protein (Total): 6.6 g/dL (ref 6.0–8.2)

## 2014-04-13 LAB — COOXIMETRY PANEL, MIXED VEN
Carboxyhemoglobin, Mixed VEN: 1.6 %
Carboxyhemoglobin, Mixed VEN: 1.5 %
Hemoglobin, MIXED VEN: 9.6 g/dL — ABNORMAL LOW (ref 13.0–18.0)
Hemoglobin, MIXED VEN: 9.7 g/dL — ABNORMAL LOW (ref 13.0–18.0)
Methemoglobin, Mixed VEN: 0.4 % (ref <3.0)
Methemoglobin, Mixed VEN: 0.8 % (ref <3.0)
O2 Content, Mixed VEN: 9.7 VOL%
O2 Content, Mixed VEN: 6.7 VOL%
O2 Sat (Frac.), Mixed VEN: 72 %
O2 Sat (Frac.), Mixed VEN: 49 %
O2 Sat (Func.), Mixed VEN: 74 %
O2 Sat (Func.), Mixed VEN: 50 %

## 2014-04-13 LAB — R/O MRSA

## 2014-04-13 LAB — POTASSIUM, WBLD
Potassium: 4.1 mEq/L (ref 3.7–5.2)
Potassium: 4.4 mEq/L (ref 3.7–5.2)

## 2014-04-13 LAB — BASIC METABOLIC PANEL
Anion Gap: 10 (ref 4–12)
Anion Gap: 9 (ref 4–12)
Calcium: 8.8 mg/dL — ABNORMAL LOW (ref 8.9–10.2)
Calcium: 8.8 mg/dL — ABNORMAL LOW (ref 8.9–10.2)
Carbon Dioxide, Total: 29 mEq/L (ref 22–32)
Carbon Dioxide, Total: 29 mEq/L (ref 22–32)
Chloride: 82 mEq/L — ABNORMAL LOW (ref 98–108)
Chloride: 86 mEq/L — ABNORMAL LOW (ref 98–108)
Creatinine: 1.32 mg/dL — ABNORMAL HIGH (ref 0.51–1.18)
Creatinine: 1.23 mg/dL — ABNORMAL HIGH (ref 0.51–1.18)
GFR, Calc, African American: >60 mL/min (ref >59)
GFR, Calc, African American: >60 mL/min (ref >59)
GFR, Calc, European American: 55 mL/min — ABNORMAL LOW (ref >59)
GFR, Calc, European American: 60 mL/min (ref >59)
Glucose: 105 mg/dL (ref 62–125)
Glucose: 120 mg/dL (ref 62–125)
Potassium: 3.8 mEq/L (ref 3.6–5.2)
Potassium: 4.2 mEq/L (ref 3.6–5.2)
Sodium: 121 mEq/L — ABNORMAL LOW (ref 135–145)
Sodium: 124 mEq/L — ABNORMAL LOW (ref 135–145)
Urea Nitrogen: 25 mg/dL — ABNORMAL HIGH (ref 8–21)
Urea Nitrogen: 25 mg/dL — ABNORMAL HIGH (ref 8–21)

## 2014-04-13 LAB — CBC (HEMOGRAM)
Hematocrit: 29 % — ABNORMAL LOW (ref 38–50)
Hemoglobin: 9.6 g/dL — ABNORMAL LOW (ref 13.0–18.0)
MCH: 31.6 pg (ref 27.3–33.6)
MCHC: 33.2 g/dL (ref 32.2–36.5)
MCV: 95 fL (ref 81–98)
Platelet Count: 110 10*3/uL — ABNORMAL LOW (ref 150–400)
RBC: 3.04 mil/uL — ABNORMAL LOW (ref 4.40–5.60)
RDW-CV: 18.5 % — ABNORMAL HIGH (ref 11.6–14.4)
WBC: 9.9 10*3/uL (ref 4.3–10.0)

## 2014-04-13 LAB — LAB ADD ON ORDER

## 2014-04-13 LAB — PHOSPHATE: Phosphate: 3.3 mg/dL (ref 2.5–4.5)

## 2014-04-13 LAB — MAGNESIUM: Magnesium: 2.2 mg/dL (ref 1.8–2.4)

## 2014-04-13 LAB — PROTHROMBIN TIME
Prothrombin INR: 1.2 (ref 0.8–1.3)
Prothrombin Time Patient: 15.1 s (ref 10.7–15.6)

## 2014-04-14 ENCOUNTER — Encounter (HOSPITAL_BASED_OUTPATIENT_CLINIC_OR_DEPARTMENT_OTHER): Payer: Self-pay

## 2014-04-14 DIAGNOSIS — I10 Essential (primary) hypertension: Secondary | ICD-10-CM

## 2014-04-14 DIAGNOSIS — Z95810 Presence of automatic (implantable) cardiac defibrillator: Secondary | ICD-10-CM

## 2014-04-14 DIAGNOSIS — E780 Pure hypercholesterolemia: Secondary | ICD-10-CM

## 2014-04-14 DIAGNOSIS — R0609 Other forms of dyspnea: Secondary | ICD-10-CM

## 2014-04-14 LAB — URINALYSIS COMPLETE, URN
Bacteria, URN: NONE SEEN
Bilirubin (Qual), URN: NEGATIVE
Epith Cells_Renal/Trans,URN: NEGATIVE /HPF
Epith Cells_Squamous, URN: NEGATIVE /LPF
Glucose Qual, URN: NEGATIVE mg/dL
Ketones, URN: NEGATIVE mg/dL
Leukocyte Esterase, URN: POSITIVE — AB
Nitrite, URN: NEGATIVE
Protein (Alb Semiquant), URN: NEGATIVE mg/dL
RBC, URN: NEGATIVE /HPF
Specific Gravity, URN: 1.006 g/mL (ref 1.002–1.027)
Urobilinogen, URN: 2 EHRLICH UNITS — AB
pH, URN: 7 (ref 5.0–8.0)

## 2014-04-14 LAB — PARTIAL THROMBOPLASTIN TIME
Partial Thromboplastin Time: 64 s — ABNORMAL HIGH (ref 22–35)
Partial Thromboplastin Time: 32 s (ref 22–35)

## 2014-04-14 LAB — LAB ADD ON ORDER

## 2014-04-14 LAB — COOXIMETRY PANEL, MIXED VEN
Carboxyhemoglobin, Mixed VEN: 1.3 %
Carboxyhemoglobin, Mixed VEN: 1.3 %
Hemoglobin, MIXED VEN: 9.1 g/dL — ABNORMAL LOW (ref 13.0–18.0)
Hemoglobin, MIXED VEN: 9.6 g/dL — ABNORMAL LOW (ref 13.0–18.0)
Methemoglobin, Mixed VEN: 1.1 % (ref <3.0)
Methemoglobin, Mixed VEN: 1 % (ref <3.0)
O2 Content, Mixed VEN: 7.6 VOL%
O2 Content, Mixed VEN: 7.6 VOL%
O2 Sat (Frac.), Mixed VEN: 59 %
O2 Sat (Frac.), Mixed VEN: 56 %
O2 Sat (Func.), Mixed VEN: 61 %
O2 Sat (Func.), Mixed VEN: 57 %

## 2014-04-14 LAB — BASIC METABOLIC PANEL
Anion Gap: 9 (ref 4–12)
Calcium: 8.6 mg/dL — ABNORMAL LOW (ref 8.9–10.2)
Carbon Dioxide, Total: 31 mEq/L (ref 22–32)
Chloride: 84 mEq/L — ABNORMAL LOW (ref 98–108)
Creatinine: 1.1 mg/dL (ref 0.51–1.18)
GFR, Calc, African American: >60 mL/min (ref >59)
GFR, Calc, European American: >60 mL/min (ref >59)
Glucose: 121 mg/dL (ref 62–125)
Potassium: 3.4 mEq/L — ABNORMAL LOW (ref 3.6–5.2)
Sodium: 124 mEq/L — ABNORMAL LOW (ref 135–145)
Urea Nitrogen: 24 mg/dL — ABNORMAL HIGH (ref 8–21)

## 2014-04-14 LAB — CBC (HEMOGRAM)
Hematocrit: 26 % — ABNORMAL LOW (ref 38–50)
Hemoglobin: 8.8 g/dL — ABNORMAL LOW (ref 13.0–18.0)
MCH: 32 pg (ref 27.3–33.6)
MCHC: 33.3 g/dL (ref 32.2–36.5)
MCV: 96 fL (ref 81–98)
Platelet Count: 95 10*3/uL — ABNORMAL LOW (ref 150–400)
RBC: 2.75 mil/uL — ABNORMAL LOW (ref 4.40–5.60)
RDW-CV: 18.5 % — ABNORMAL HIGH (ref 11.6–14.4)
WBC: 9.35 10*3/uL (ref 4.3–10.0)

## 2014-04-14 LAB — POTASSIUM, SERUM
Potassium: 3.2 mEq/L — ABNORMAL LOW (ref 3.6–5.2)
Potassium: 4.5 mEq/L (ref 3.6–5.2)

## 2014-04-14 LAB — PROTHROMBIN & PTT
Partial Thromboplastin Time: 82 s — ABNORMAL HIGH (ref 22–35)
Prothrombin INR: 1.2 (ref 0.8–1.3)
Prothrombin Time Patient: 15.1 s (ref 10.7–15.6)

## 2014-04-14 LAB — SODIUM: Sodium: 125 mEq/L — ABNORMAL LOW (ref 135–145)

## 2014-04-14 LAB — CALCIUM (IONIZED), WB: Calcium (Ionized): 1.11 mmol/L — ABNORMAL LOW (ref 1.18–1.38)

## 2014-04-14 LAB — MAGNESIUM: Magnesium: 2 mg/dL (ref 1.8–2.4)

## 2014-04-15 DIAGNOSIS — E46 Unspecified protein-calorie malnutrition: Secondary | ICD-10-CM

## 2014-04-15 DIAGNOSIS — Z7901 Long term (current) use of anticoagulants: Secondary | ICD-10-CM

## 2014-04-15 DIAGNOSIS — M109 Gout, unspecified: Secondary | ICD-10-CM

## 2014-04-15 DIAGNOSIS — I429 Cardiomyopathy, unspecified: Secondary | ICD-10-CM

## 2014-04-15 LAB — BASIC METABOLIC PANEL
Anion Gap: 9 (ref 4–12)
Anion Gap: 10 (ref 4–12)
Calcium: 9 mg/dL (ref 8.9–10.2)
Calcium: 9 mg/dL (ref 8.9–10.2)
Carbon Dioxide, Total: 29 mEq/L (ref 22–32)
Carbon Dioxide, Total: 28 mEq/L (ref 22–32)
Chloride: 88 mEq/L — ABNORMAL LOW (ref 98–108)
Chloride: 91 mEq/L — ABNORMAL LOW (ref 98–108)
Creatinine: 1.2 mg/dL — ABNORMAL HIGH (ref 0.51–1.18)
Creatinine: 1.23 mg/dL — ABNORMAL HIGH (ref 0.51–1.18)
GFR, Calc, African American: >60 mL/min (ref >59)
GFR, Calc, African American: >60 mL/min (ref >59)
GFR, Calc, European American: >60 mL/min (ref >59)
GFR, Calc, European American: 60 mL/min (ref >59)
Glucose: 153 mg/dL — ABNORMAL HIGH (ref 62–125)
Glucose: 137 mg/dL — ABNORMAL HIGH (ref 62–125)
Potassium: 3.6 mEq/L (ref 3.6–5.2)
Potassium: 4 mEq/L (ref 3.6–5.2)
Sodium: 126 mEq/L — ABNORMAL LOW (ref 135–145)
Sodium: 129 mEq/L — ABNORMAL LOW (ref 135–145)
Urea Nitrogen: 22 mg/dL — ABNORMAL HIGH (ref 8–21)
Urea Nitrogen: 23 mg/dL — ABNORMAL HIGH (ref 8–21)

## 2014-04-15 LAB — SODIUM
Sodium: 126 mEq/L — ABNORMAL LOW (ref 135–145)
Sodium: 130 mEq/L — ABNORMAL LOW (ref 135–145)
Sodium: 130 mEq/L — ABNORMAL LOW (ref 135–145)

## 2014-04-15 LAB — PROTHROMBIN & PTT
Partial Thromboplastin Time: 58 s — ABNORMAL HIGH (ref 22–35)
Prothrombin INR: 1.3 (ref 0.8–1.3)
Prothrombin Time Patient: 15.4 s (ref 10.7–15.6)

## 2014-04-15 LAB — VANCOMYCIN, TROUGH LEVEL: Vancomycin, Trough Level: 20.2 ug/mL — ABNORMAL HIGH (ref 5.0–20.0)

## 2014-04-15 LAB — PARTIAL THROMBOPLASTIN TIME
Partial Thromboplastin Time: 33 s (ref 22–35)
Partial Thromboplastin Time: 63 s — ABNORMAL HIGH (ref 22–35)

## 2014-04-15 LAB — COOXIMETRY PANEL, MIXED VEN
Carboxyhemoglobin, Mixed VEN: 1.4 %
Hemoglobin, MIXED VEN: 9.1 g/dL — ABNORMAL LOW (ref 13.0–18.0)
Methemoglobin, Mixed VEN: 1.2 % (ref <3.0)
O2 Content, Mixed VEN: 8.3 VOL%
O2 Sat (Frac.), Mixed VEN: 65 %
O2 Sat (Func.), Mixed VEN: 67 %

## 2014-04-15 LAB — CBC (HEMOGRAM)
Hematocrit: 26 % — ABNORMAL LOW (ref 38–50)
Hemoglobin: 8.7 g/dL — ABNORMAL LOW (ref 13.0–18.0)
MCH: 31.6 pg (ref 27.3–33.6)
MCHC: 33.1 g/dL (ref 32.2–36.5)
MCV: 96 fL (ref 81–98)
Platelet Count: 95 10*3/uL — ABNORMAL LOW (ref 150–400)
RBC: 2.75 mil/uL — ABNORMAL LOW (ref 4.40–5.60)
RDW-CV: 18.5 % — ABNORMAL HIGH (ref 11.6–14.4)
WBC: 10.12 10*3/uL — ABNORMAL HIGH (ref 4.3–10.0)

## 2014-04-15 LAB — POTASSIUM, SERUM
Potassium: 3.7 mEq/L (ref 3.6–5.2)
Potassium: 3.7 mEq/L (ref 3.6–5.2)
Potassium: 3.9 mEq/L (ref 3.6–5.2)

## 2014-04-15 LAB — POTASSIUM, WBLD: Potassium: 4.1 mEq/L (ref 3.7–5.2)

## 2014-04-16 LAB — URINE C/S: Colony Count: >100000

## 2014-04-16 LAB — BASIC METABOLIC PANEL
Anion Gap: 9 (ref 4–12)
Anion Gap: 8 (ref 4–12)
Anion Gap: 9 (ref 4–12)
Calcium: 9.4 mg/dL (ref 8.9–10.2)
Calcium: 9.2 mg/dL (ref 8.9–10.2)
Calcium: 9.3 mg/dL (ref 8.9–10.2)
Carbon Dioxide, Total: 30 mEq/L (ref 22–32)
Carbon Dioxide, Total: 29 mEq/L (ref 22–32)
Carbon Dioxide, Total: 29 mEq/L (ref 22–32)
Chloride: 93 mEq/L — ABNORMAL LOW (ref 98–108)
Chloride: 94 mEq/L — ABNORMAL LOW (ref 98–108)
Chloride: 94 mEq/L — ABNORMAL LOW (ref 98–108)
Creatinine: 1.24 mg/dL — ABNORMAL HIGH (ref 0.51–1.18)
Creatinine: 1.13 mg/dL (ref 0.51–1.18)
Creatinine: 1.12 mg/dL (ref 0.51–1.18)
GFR, Calc, African American: >60 mL/min (ref >59)
GFR, Calc, African American: >60 mL/min (ref >59)
GFR, Calc, African American: >60 mL/min (ref >59)
GFR, Calc, European American: 59 mL/min — ABNORMAL LOW (ref >59)
GFR, Calc, European American: >60 mL/min (ref >59)
GFR, Calc, European American: >60 mL/min (ref >59)
Glucose: 124 mg/dL (ref 62–125)
Glucose: 117 mg/dL (ref 62–125)
Glucose: 118 mg/dL (ref 62–125)
Potassium: 3.2 mEq/L — ABNORMAL LOW (ref 3.6–5.2)
Potassium: 3.9 mEq/L (ref 3.6–5.2)
Potassium: 3.6 mEq/L (ref 3.6–5.2)
Sodium: 132 mEq/L — ABNORMAL LOW (ref 135–145)
Sodium: 131 mEq/L — ABNORMAL LOW (ref 135–145)
Sodium: 132 mEq/L — ABNORMAL LOW (ref 135–145)
Urea Nitrogen: 24 mg/dL — ABNORMAL HIGH (ref 8–21)
Urea Nitrogen: 22 mg/dL — ABNORMAL HIGH (ref 8–21)
Urea Nitrogen: 24 mg/dL — ABNORMAL HIGH (ref 8–21)

## 2014-04-16 LAB — POTASSIUM, WBLD: Potassium: 3.5 mEq/L — ABNORMAL LOW (ref 3.7–5.2)

## 2014-04-16 LAB — LAB ADD ON ORDER

## 2014-04-16 LAB — CBC (HEMOGRAM)
Hematocrit: 27 % — ABNORMAL LOW (ref 38–50)
Hemoglobin: 8.9 g/dL — ABNORMAL LOW (ref 13.0–18.0)
MCH: 31.8 pg (ref 27.3–33.6)
MCHC: 32.5 g/dL (ref 32.2–36.5)
MCV: 98 fL (ref 81–98)
Platelet Count: 95 10*3/uL — ABNORMAL LOW (ref 150–400)
RBC: 2.8 mil/uL — ABNORMAL LOW (ref 4.40–5.60)
RDW-CV: 18.5 % — ABNORMAL HIGH (ref 11.6–14.4)
WBC: 8.71 10*3/uL (ref 4.3–10.0)

## 2014-04-16 LAB — COOXIMETRY PANEL, MIXED VEN
Carboxyhemoglobin, Mixed VEN: 1.5 %
Hemoglobin, MIXED VEN: 9 g/dL — ABNORMAL LOW (ref 13.0–18.0)
Methemoglobin, Mixed VEN: 0.7 % (ref <3.0)
O2 Content, Mixed VEN: 7.9 VOL%
O2 Sat (Frac.), Mixed VEN: 62 %
O2 Sat (Func.), Mixed VEN: 64 %

## 2014-04-16 LAB — SODIUM: Sodium: 133 mEq/L — ABNORMAL LOW (ref 135–145)

## 2014-04-16 LAB — MAGNESIUM: Magnesium: 2 mg/dL (ref 1.8–2.4)

## 2014-04-16 LAB — PARTIAL THROMBOPLASTIN TIME: Partial Thromboplastin Time: 67 s — ABNORMAL HIGH (ref 22–35)

## 2014-04-16 LAB — VANCOMYCIN, TROUGH LEVEL: Vancomycin, Trough Level: 25.7 ug/mL — ABNORMAL HIGH (ref 5.0–20.0)

## 2014-04-16 LAB — PROTHROMBIN TIME
Prothrombin INR: 1.3 (ref 0.8–1.3)
Prothrombin Time Patient: 15.3 s (ref 10.7–15.6)

## 2014-04-16 LAB — CALCIUM (IONIZED), WB: Calcium (Ionized): 1.18 mmol/L (ref 1.18–1.38)

## 2014-04-17 LAB — LAB ADD ON ORDER

## 2014-04-17 LAB — COOXIMETRY PANEL, MIXED VEN
Carboxyhemoglobin, Mixed VEN: 1.5 %
Hemoglobin, MIXED VEN: 7.8 g/dL — ABNORMAL LOW (ref 13.0–18.0)
Methemoglobin, Mixed VEN: 0.8 % (ref <3.0)
O2 Content, Mixed VEN: 6.5 VOL%
O2 Sat (Frac.), Mixed VEN: 59 %
O2 Sat (Func.), Mixed VEN: 61 %

## 2014-04-17 LAB — PROTHROMBIN & PTT
Partial Thromboplastin Time: 71 s — ABNORMAL HIGH (ref 22–35)
Partial Thromboplastin Time: 41 s — ABNORMAL HIGH (ref 22–35)
Prothrombin INR: 1.3 (ref 0.8–1.3)
Prothrombin INR: 1.2 (ref 0.8–1.3)
Prothrombin Time Patient: 15.5 s (ref 10.7–15.6)
Prothrombin Time Patient: 15.1 s (ref 10.7–15.6)

## 2014-04-17 LAB — BASIC METABOLIC PANEL
Anion Gap: 9 (ref 4–12)
Anion Gap: 9 (ref 4–12)
Calcium: 9.6 mg/dL (ref 8.9–10.2)
Calcium: 9.5 mg/dL (ref 8.9–10.2)
Carbon Dioxide, Total: 31 mEq/L (ref 22–32)
Carbon Dioxide, Total: 31 mEq/L (ref 22–32)
Chloride: 94 mEq/L — ABNORMAL LOW (ref 98–108)
Chloride: 92 mEq/L — ABNORMAL LOW (ref 98–108)
Creatinine: 1.25 mg/dL — ABNORMAL HIGH (ref 0.51–1.18)
Creatinine: 1.16 mg/dL (ref 0.51–1.18)
GFR, Calc, African American: >60 mL/min (ref >59)
GFR, Calc, African American: >60 mL/min (ref >59)
GFR, Calc, European American: 59 mL/min — ABNORMAL LOW (ref >59)
GFR, Calc, European American: >60 mL/min (ref >59)
Glucose: 109 mg/dL (ref 62–125)
Glucose: 144 mg/dL — ABNORMAL HIGH (ref 62–125)
Potassium: 3.3 mEq/L — ABNORMAL LOW (ref 3.6–5.2)
Potassium: 4 mEq/L (ref 3.6–5.2)
Sodium: 134 mEq/L — ABNORMAL LOW (ref 135–145)
Sodium: 132 mEq/L — ABNORMAL LOW (ref 135–145)
Urea Nitrogen: 25 mg/dL — ABNORMAL HIGH (ref 8–21)
Urea Nitrogen: 24 mg/dL — ABNORMAL HIGH (ref 8–21)

## 2014-04-17 LAB — CBC (HEMOGRAM)
Hematocrit: 25 % — ABNORMAL LOW (ref 38–50)
Hemoglobin: 8.2 g/dL — ABNORMAL LOW (ref 13.0–18.0)
MCH: 31.7 pg (ref 27.3–33.6)
MCHC: 32.7 g/dL (ref 32.2–36.5)
MCV: 97 fL (ref 81–98)
Platelet Count: 105 10*3/uL — ABNORMAL LOW (ref 150–400)
RBC: 2.59 mil/uL — ABNORMAL LOW (ref 4.40–5.60)
RDW-CV: 18.7 % — ABNORMAL HIGH (ref 11.6–14.4)
WBC: 8.96 10*3/uL (ref 4.3–10.0)

## 2014-04-17 LAB — DIGOXIN: Digoxin: 0.3 ng/mL — ABNORMAL LOW (ref 0.5–1.9)

## 2014-04-17 LAB — HEPATIC FUNCTION PANEL
ALT (GPT): 15 U/L (ref 10–48)
AST (GOT): 21 U/L (ref 9–38)
Albumin: 3.8 g/dL (ref 3.5–5.2)
Alkaline Phosphatase (Total): 125 U/L (ref 37–159)
Bilirubin (Direct): 0.7 mg/dL — ABNORMAL HIGH (ref 0.0–0.3)
Bilirubin (Total): 1.5 mg/dL — ABNORMAL HIGH (ref 0.2–1.3)
Protein (Total): 6.5 g/dL (ref 6.0–8.2)

## 2014-04-17 LAB — POTASSIUM, SERUM
Potassium: 3.7 mEq/L (ref 3.6–5.2)
Potassium: 4.6 mEq/L (ref 3.6–5.2)

## 2014-04-17 LAB — SODIUM
Sodium: 134 mEq/L — ABNORMAL LOW (ref 135–145)
Sodium: 127 mEq/L — ABNORMAL LOW (ref 135–145)

## 2014-04-17 LAB — CALCIUM (IONIZED), WB: Calcium (Ionized): 1.19 mmol/L (ref 1.18–1.38)

## 2014-04-17 LAB — MAGNESIUM: Magnesium: 2.1 mg/dL (ref 1.8–2.4)

## 2014-04-17 LAB — PARTIAL THROMBOPLASTIN TIME: Partial Thromboplastin Time: 58 s — ABNORMAL HIGH (ref 22–35)

## 2014-04-17 LAB — POTASSIUM, WBLD
Potassium: 3.2 mEq/L — ABNORMAL LOW (ref 3.7–5.2)
Potassium: 3.5 mEq/L — ABNORMAL LOW (ref 3.7–5.2)

## 2014-04-18 LAB — BASIC METABOLIC PANEL
Anion Gap: 7 (ref 4–12)
Anion Gap: 9 (ref 4–12)
Calcium: 9.5 mg/dL (ref 8.9–10.2)
Calcium: 9.2 mg/dL (ref 8.9–10.2)
Carbon Dioxide, Total: 32 mEq/L (ref 22–32)
Carbon Dioxide, Total: 30 mEq/L (ref 22–32)
Chloride: 89 mEq/L — ABNORMAL LOW (ref 98–108)
Chloride: 90 mEq/L — ABNORMAL LOW (ref 98–108)
Creatinine: 1.3 mg/dL — ABNORMAL HIGH (ref 0.51–1.18)
Creatinine: 1.39 mg/dL — ABNORMAL HIGH (ref 0.51–1.18)
GFR, Calc, African American: >60 mL/min (ref >59)
GFR, Calc, African American: >60 mL/min (ref >59)
GFR, Calc, European American: 56 mL/min — ABNORMAL LOW (ref >59)
GFR, Calc, European American: 52 mL/min — ABNORMAL LOW (ref >59)
Glucose: 98 mg/dL (ref 62–125)
Glucose: 123 mg/dL (ref 62–125)
Potassium: 3.6 mEq/L (ref 3.6–5.2)
Potassium: 4.6 mEq/L (ref 3.6–5.2)
Sodium: 128 mEq/L — ABNORMAL LOW (ref 135–145)
Sodium: 129 mEq/L — ABNORMAL LOW (ref 135–145)
Urea Nitrogen: 27 mg/dL — ABNORMAL HIGH (ref 8–21)
Urea Nitrogen: 28 mg/dL — ABNORMAL HIGH (ref 8–21)

## 2014-04-18 LAB — MAGNESIUM
Magnesium: 2 mg/dL (ref 1.8–2.4)
Magnesium: 1.9 mg/dL (ref 1.8–2.4)

## 2014-04-18 LAB — SODIUM
Sodium: 129 mEq/L — ABNORMAL LOW (ref 135–145)
Sodium: 128 mEq/L — ABNORMAL LOW (ref 135–145)

## 2014-04-18 LAB — PARTIAL THROMBOPLASTIN TIME
Partial Thromboplastin Time: 51 s — ABNORMAL HIGH (ref 22–35)
Partial Thromboplastin Time: 53 s — ABNORMAL HIGH (ref 22–35)

## 2014-04-18 LAB — CBC (HEMOGRAM)
Hematocrit: 26 % — ABNORMAL LOW (ref 38–50)
Hemoglobin: 8.4 g/dL — ABNORMAL LOW (ref 13.0–18.0)
MCH: 31.8 pg (ref 27.3–33.6)
MCHC: 32.7 g/dL (ref 32.2–36.5)
MCV: 97 fL (ref 81–98)
Platelet Count: 129 10*3/uL — ABNORMAL LOW (ref 150–400)
RBC: 2.64 mil/uL — ABNORMAL LOW (ref 4.40–5.60)
RDW-CV: 18.5 % — ABNORMAL HIGH (ref 11.6–14.4)
WBC: 10.26 10*3/uL — ABNORMAL HIGH (ref 4.3–10.0)

## 2014-04-18 LAB — PROTHROMBIN & PTT
Partial Thromboplastin Time: 57 s — ABNORMAL HIGH (ref 22–35)
Prothrombin INR: 1.3 (ref 0.8–1.3)
Prothrombin Time Patient: 15.7 s — ABNORMAL HIGH (ref 10.7–15.6)

## 2014-04-18 LAB — PHOSPHATE: Phosphate: 5.1 mg/dL — ABNORMAL HIGH (ref 2.5–4.5)

## 2014-04-18 LAB — POTASSIUM, SERUM
Potassium: 3.5 mEq/L — ABNORMAL LOW (ref 3.6–5.2)
Potassium: 3.4 mEq/L — ABNORMAL LOW (ref 3.6–5.2)

## 2014-04-18 LAB — IONIZED CALCIUM, PLASMA: Ionized Calcium, Plasma: 1.22 mmol/L (ref 1.18–1.38)

## 2014-04-18 LAB — COOXIMETRY PANEL, MIXED VEN
Carboxyhemoglobin, Mixed VEN: 1.4 %
Hemoglobin, MIXED VEN: 8.8 g/dL — ABNORMAL LOW (ref 13.0–18.0)
Methemoglobin, Mixed VEN: 1.2 % (ref <3.0)
O2 Content, Mixed VEN: 7.6 VOL%
O2 Sat (Frac.), Mixed VEN: 61 %
O2 Sat (Func.), Mixed VEN: 63 %

## 2014-04-18 LAB — CALCIUM (IONIZED), WB: Calcium (Ionized): 1.17 mmol/L — ABNORMAL LOW (ref 1.18–1.38)

## 2014-04-19 LAB — CBC (HEMOGRAM)
Hematocrit: 25 % — ABNORMAL LOW (ref 38–50)
Hemoglobin: 8.2 g/dL — ABNORMAL LOW (ref 13.0–18.0)
MCH: 31.7 pg (ref 27.3–33.6)
MCHC: 32.9 g/dL (ref 32.2–36.5)
MCV: 96 fL (ref 81–98)
Platelet Count: 123 10*3/uL — ABNORMAL LOW (ref 150–400)
RBC: 2.59 mil/uL — ABNORMAL LOW (ref 4.40–5.60)
RDW-CV: 18.4 % — ABNORMAL HIGH (ref 11.6–14.4)
WBC: 8.42 10*3/uL (ref 4.3–10.0)

## 2014-04-19 LAB — COOXIMETRY PANEL, MIXED VEN
Carboxyhemoglobin, Mixed VEN: 1.6 %
Hemoglobin, MIXED VEN: 8.3 g/dL — ABNORMAL LOW (ref 13.0–18.0)
Methemoglobin, Mixed VEN: 0.6 % (ref <3.0)
O2 Content, Mixed VEN: 6.7 VOL%
O2 Sat (Frac.), Mixed VEN: 57 %
O2 Sat (Func.), Mixed VEN: 58 %

## 2014-04-19 LAB — POTASSIUM, SERUM
Potassium: 3.2 mEq/L — ABNORMAL LOW (ref 3.6–5.2)
Potassium: 5.2 mEq/L (ref 3.6–5.2)

## 2014-04-19 LAB — BASIC METABOLIC PANEL
Anion Gap: 11 (ref 4–12)
Anion Gap: 8 (ref 4–12)
Calcium: 9.2 mg/dL (ref 8.9–10.2)
Calcium: 9.1 mg/dL (ref 8.9–10.2)
Carbon Dioxide, Total: 29 mEq/L (ref 22–32)
Carbon Dioxide, Total: 27 mEq/L (ref 22–32)
Chloride: 89 mEq/L — ABNORMAL LOW (ref 98–108)
Chloride: 91 mEq/L — ABNORMAL LOW (ref 98–108)
Creatinine: 1.31 mg/dL — ABNORMAL HIGH (ref 0.51–1.18)
Creatinine: 1.23 mg/dL — ABNORMAL HIGH (ref 0.51–1.18)
GFR, Calc, African American: >60 mL/min (ref >59)
GFR, Calc, African American: >60 mL/min (ref >59)
GFR, Calc, European American: 55 mL/min — ABNORMAL LOW (ref >59)
GFR, Calc, European American: 60 mL/min (ref >59)
Glucose: 100 mg/dL (ref 62–125)
Glucose: 135 mg/dL — ABNORMAL HIGH (ref 62–125)
Potassium: 3.6 mEq/L (ref 3.6–5.2)
Potassium: 5.7 mEq/L — ABNORMAL HIGH (ref 3.6–5.2)
Sodium: 129 mEq/L — ABNORMAL LOW (ref 135–145)
Sodium: 126 mEq/L — ABNORMAL LOW (ref 135–145)
Urea Nitrogen: 29 mg/dL — ABNORMAL HIGH (ref 8–21)
Urea Nitrogen: 26 mg/dL — ABNORMAL HIGH (ref 8–21)

## 2014-04-19 LAB — DIGOXIN: Digoxin: 0.4 ng/mL — ABNORMAL LOW (ref 0.5–1.9)

## 2014-04-19 LAB — BLOOD C/S (LINE): Culture: NO GROWTH

## 2014-04-19 LAB — CALCIUM (IONIZED), WB: Calcium (Ionized): 1.17 mmol/L — ABNORMAL LOW (ref 1.18–1.38)

## 2014-04-19 LAB — LAB ADD ON ORDER

## 2014-04-19 LAB — PROTHROMBIN & PTT
Partial Thromboplastin Time: 55 s — ABNORMAL HIGH (ref 22–35)
Prothrombin INR: 1.4 — ABNORMAL HIGH (ref 0.8–1.3)
Prothrombin Time Patient: 16.1 s — ABNORMAL HIGH (ref 10.7–15.6)

## 2014-04-19 LAB — PARTIAL THROMBOPLASTIN TIME: Partial Thromboplastin Time: 50 s — ABNORMAL HIGH (ref 22–35)

## 2014-04-19 LAB — MAGNESIUM
Magnesium: 2.6 mg/dL — ABNORMAL HIGH (ref 1.8–2.4)
Magnesium: 2.2 mg/dL (ref 1.8–2.4)

## 2014-04-19 LAB — SODIUM
Sodium: 129 mEq/L — ABNORMAL LOW (ref 135–145)
Sodium: 127 mEq/L — ABNORMAL LOW (ref 135–145)

## 2014-04-19 LAB — BLOOD C/S: Culture: NO GROWTH

## 2014-04-19 LAB — CALCIUM, (REFLEXIVE IONIZED)

## 2014-04-19 LAB — CALCIUM: Calcium: 9.6 mg/dL (ref 8.9–10.2)

## 2014-04-19 LAB — PHOSPHATE: Phosphate: 5 mg/dL — ABNORMAL HIGH (ref 2.5–4.5)

## 2014-04-20 LAB — BASIC METABOLIC PANEL
Anion Gap: 8 (ref 4–12)
Anion Gap: 11 (ref 4–12)
Calcium: 9.2 mg/dL (ref 8.9–10.2)
Calcium: 9.2 mg/dL (ref 8.9–10.2)
Carbon Dioxide, Total: 28 mEq/L (ref 22–32)
Carbon Dioxide, Total: 27 mEq/L (ref 22–32)
Chloride: 89 mEq/L — ABNORMAL LOW (ref 98–108)
Chloride: 88 mEq/L — ABNORMAL LOW (ref 98–108)
Creatinine: 1.49 mg/dL — ABNORMAL HIGH (ref 0.51–1.18)
Creatinine: 1.58 mg/dL — ABNORMAL HIGH (ref 0.51–1.18)
GFR, Calc, African American: 58 mL/min — ABNORMAL LOW (ref >59)
GFR, Calc, African American: 54 mL/min — ABNORMAL LOW (ref >59)
GFR, Calc, European American: 48 mL/min — ABNORMAL LOW (ref >59)
GFR, Calc, European American: 45 mL/min — ABNORMAL LOW (ref >59)
Glucose: 118 mg/dL (ref 62–125)
Glucose: 101 mg/dL (ref 62–125)
Potassium: 4.4 mEq/L (ref 3.6–5.2)
Potassium: 4.3 mEq/L (ref 3.6–5.2)
Sodium: 125 mEq/L — ABNORMAL LOW (ref 135–145)
Sodium: 126 mEq/L — ABNORMAL LOW (ref 135–145)
Urea Nitrogen: 28 mg/dL — ABNORMAL HIGH (ref 8–21)
Urea Nitrogen: 30 mg/dL — ABNORMAL HIGH (ref 8–21)

## 2014-04-20 LAB — PROTHROMBIN & PTT
Partial Thromboplastin Time: 59 s — ABNORMAL HIGH (ref 22–35)
Prothrombin INR: 1.3 (ref 0.8–1.3)
Prothrombin Time Patient: 15.2 s (ref 10.7–15.6)

## 2014-04-20 LAB — HEPATIC FUNCTION PANEL
ALT (GPT): 17 U/L (ref 10–48)
AST (GOT): 20 U/L (ref 9–38)
Albumin: 3.7 g/dL (ref 3.5–5.2)
Alkaline Phosphatase (Total): 118 U/L (ref 37–159)
Bilirubin (Direct): 0.7 mg/dL — ABNORMAL HIGH (ref 0.0–0.3)
Bilirubin (Total): 1.3 mg/dL (ref 0.2–1.3)
Protein (Total): 6.4 g/dL (ref 6.0–8.2)

## 2014-04-20 LAB — COOXIMETRY PANEL, MIXED VEN
Carboxyhemoglobin, Mixed VEN: 1.3 %
Hemoglobin, MIXED VEN: 8.1 g/dL — ABNORMAL LOW (ref 13.0–18.0)
Methemoglobin, Mixed VEN: 1.1 % (ref <3.0)
O2 Content, Mixed VEN: 5.5 VOL%
O2 Sat (Frac.), Mixed VEN: 48 %
O2 Sat (Func.), Mixed VEN: 49 %

## 2014-04-20 LAB — CBC (HEMOGRAM)
Hematocrit: 24 % — ABNORMAL LOW (ref 38–50)
Hemoglobin: 7.8 g/dL — ABNORMAL LOW (ref 13.0–18.0)
MCH: 31.7 pg (ref 27.3–33.6)
MCHC: 32.6 g/dL (ref 32.2–36.5)
MCV: 97 fL (ref 81–98)
Platelet Count: 135 10*3/uL — ABNORMAL LOW (ref 150–400)
RBC: 2.46 mil/uL — ABNORMAL LOW (ref 4.40–5.60)
RDW-CV: 18.6 % — ABNORMAL HIGH (ref 11.6–14.4)
WBC: 11.46 10*3/uL — ABNORMAL HIGH (ref 4.3–10.0)

## 2014-04-20 LAB — SODIUM
Sodium: 125 mEq/L — ABNORMAL LOW (ref 135–145)
Sodium: 128 mEq/L — ABNORMAL LOW (ref 135–145)

## 2014-04-20 LAB — POTASSIUM, SERUM: Potassium: 3.9 mEq/L (ref 3.6–5.2)

## 2014-04-20 LAB — PHOSPHATE: Phosphate: 4 mg/dL (ref 2.5–4.5)

## 2014-04-20 LAB — MAGNESIUM: Magnesium: 2.2 mg/dL (ref 1.8–2.4)

## 2014-04-21 LAB — BASIC METABOLIC PANEL
Anion Gap: 11 (ref 4–12)
Anion Gap: 11 (ref 4–12)
Calcium: 9.3 mg/dL (ref 8.9–10.2)
Calcium: 9.1 mg/dL (ref 8.9–10.2)
Carbon Dioxide, Total: 27 mEq/L (ref 22–32)
Carbon Dioxide, Total: 25 mEq/L (ref 22–32)
Chloride: 89 mEq/L — ABNORMAL LOW (ref 98–108)
Chloride: 90 mEq/L — ABNORMAL LOW (ref 98–108)
Creatinine: 1.69 mg/dL — ABNORMAL HIGH (ref 0.51–1.18)
Creatinine: 1.85 mg/dL — ABNORMAL HIGH (ref 0.51–1.18)
GFR, Calc, African American: 50 mL/min — ABNORMAL LOW (ref >59)
GFR, Calc, African American: 45 mL/min — ABNORMAL LOW (ref >59)
GFR, Calc, European American: 41 mL/min — ABNORMAL LOW (ref >59)
GFR, Calc, European American: 37 mL/min — ABNORMAL LOW (ref >59)
Glucose: 94 mg/dL (ref 62–125)
Glucose: 117 mg/dL (ref 62–125)
Potassium: 3.5 mEq/L — ABNORMAL LOW (ref 3.6–5.2)
Potassium: 4.8 mEq/L (ref 3.6–5.2)
Sodium: 127 mEq/L — ABNORMAL LOW (ref 135–145)
Sodium: 126 mEq/L — ABNORMAL LOW (ref 135–145)
Urea Nitrogen: 34 mg/dL — ABNORMAL HIGH (ref 8–21)
Urea Nitrogen: 36 mg/dL — ABNORMAL HIGH (ref 8–21)

## 2014-04-21 LAB — COOXIMETRY PANEL, MIXED VEN
Carboxyhemoglobin, Mixed VEN: 1.4 %
Hemoglobin, MIXED VEN: 9.5 g/dL — ABNORMAL LOW (ref 13.0–18.0)
Methemoglobin, Mixed VEN: 0.8 % (ref <3.0)
O2 Content, Mixed VEN: 8.2 VOL%
O2 Sat (Frac.), Mixed VEN: 61 %
O2 Sat (Func.), Mixed VEN: 63 %

## 2014-04-21 LAB — PROTHROMBIN TIME
Prothrombin INR: 1.2 (ref 0.8–1.3)
Prothrombin Time Patient: 15 s (ref 10.7–15.6)

## 2014-04-21 LAB — CBC (HEMOGRAM)
Hematocrit: 23 % — ABNORMAL LOW (ref 38–50)
Hemoglobin: 7.6 g/dL — ABNORMAL LOW (ref 13.0–18.0)
MCH: 31.9 pg (ref 27.3–33.6)
MCHC: 33.2 g/dL (ref 32.2–36.5)
MCV: 96 fL (ref 81–98)
Platelet Count: 139 10*3/uL — ABNORMAL LOW (ref 150–400)
RBC: 2.38 mil/uL — ABNORMAL LOW (ref 4.40–5.60)
RDW-CV: 18.6 % — ABNORMAL HIGH (ref 11.6–14.4)
WBC: 10.48 10*3/uL — ABNORMAL HIGH (ref 4.3–10.0)

## 2014-04-21 LAB — MAGNESIUM: Magnesium: 2.2 mg/dL (ref 1.8–2.4)

## 2014-04-21 LAB — POTASSIUM, SERUM: Potassium: 3.5 mEq/L — ABNORMAL LOW (ref 3.6–5.2)

## 2014-04-21 LAB — PARTIAL THROMBOPLASTIN TIME: Partial Thromboplastin Time: 46 s — ABNORMAL HIGH (ref 22–35)

## 2014-04-21 LAB — SODIUM: Sodium: 127 mEq/L — ABNORMAL LOW (ref 135–145)

## 2014-04-21 LAB — PHOSPHATE: Phosphate: 4.6 mg/dL — ABNORMAL HIGH (ref 2.5–4.5)

## 2014-04-22 LAB — CBC (HEMOGRAM)
Hematocrit: 24 % — ABNORMAL LOW (ref 38–50)
Hemoglobin: 7.9 g/dL — ABNORMAL LOW (ref 13.0–18.0)
MCH: 31.7 pg (ref 27.3–33.6)
MCHC: 32.9 g/dL (ref 32.2–36.5)
MCV: 96 fL (ref 81–98)
Platelet Count: 143 10*3/uL — ABNORMAL LOW (ref 150–400)
RBC: 2.49 mil/uL — ABNORMAL LOW (ref 4.40–5.60)
RDW-CV: 18.6 % — ABNORMAL HIGH (ref 11.6–14.4)
WBC: 10 10*3/uL (ref 4.3–10.0)

## 2014-04-22 LAB — MAGNESIUM
Magnesium: 2.1 mg/dL (ref 1.8–2.4)
Magnesium: 2 mg/dL (ref 1.8–2.4)

## 2014-04-22 LAB — POTASSIUM, SERUM
Potassium: 3.1 mEq/L — ABNORMAL LOW (ref 3.6–5.2)
Potassium: 3.7 mEq/L (ref 3.6–5.2)
Potassium: 3.9 mEq/L (ref 3.6–5.2)

## 2014-04-22 LAB — BASIC METABOLIC PANEL
Anion Gap: 13 — ABNORMAL HIGH (ref 4–12)
Anion Gap: 9 (ref 4–12)
Calcium: 9.4 mg/dL (ref 8.9–10.2)
Calcium: 9.2 mg/dL (ref 8.9–10.2)
Carbon Dioxide, Total: 25 mEq/L (ref 22–32)
Carbon Dioxide, Total: 26 mEq/L (ref 22–32)
Chloride: 91 mEq/L — ABNORMAL LOW (ref 98–108)
Chloride: 92 mEq/L — ABNORMAL LOW (ref 98–108)
Creatinine: 1.82 mg/dL — ABNORMAL HIGH (ref 0.51–1.18)
Creatinine: 1.7 mg/dL — ABNORMAL HIGH (ref 0.51–1.18)
GFR, Calc, African American: 46 mL/min — ABNORMAL LOW (ref >59)
GFR, Calc, African American: 50 mL/min — ABNORMAL LOW (ref >59)
GFR, Calc, European American: 38 mL/min — ABNORMAL LOW (ref >59)
GFR, Calc, European American: 41 mL/min — ABNORMAL LOW (ref >59)
Glucose: 134 mg/dL — ABNORMAL HIGH (ref 62–125)
Glucose: 106 mg/dL (ref 62–125)
Potassium: 3.2 mEq/L — ABNORMAL LOW (ref 3.6–5.2)
Potassium: 4.1 mEq/L (ref 3.6–5.2)
Sodium: 129 mEq/L — ABNORMAL LOW (ref 135–145)
Sodium: 127 mEq/L — ABNORMAL LOW (ref 135–145)
Urea Nitrogen: 38 mg/dL — ABNORMAL HIGH (ref 8–21)
Urea Nitrogen: 37 mg/dL — ABNORMAL HIGH (ref 8–21)

## 2014-04-22 LAB — PROTHROMBIN TIME
Prothrombin INR: 1.3 (ref 0.8–1.3)
Prothrombin Time Patient: 15.3 s (ref 10.7–15.6)

## 2014-04-22 LAB — LAB ADD ON ORDER

## 2014-04-22 LAB — COOXIMETRY PANEL, MIXED VEN
Carboxyhemoglobin, Mixed VEN: 1.8 %
Hemoglobin, MIXED VEN: 8 g/dL — ABNORMAL LOW (ref 13.0–18.0)
Methemoglobin, Mixed VEN: 0.7 % (ref <3.0)
O2 Content, Mixed VEN: 6.9 VOL%
O2 Sat (Frac.), Mixed VEN: 61 %
O2 Sat (Func.), Mixed VEN: 63 %

## 2014-04-22 LAB — PARTIAL THROMBOPLASTIN TIME: Partial Thromboplastin Time: 44 s — ABNORMAL HIGH (ref 22–35)

## 2014-04-22 LAB — DIGOXIN: Digoxin: 0.5 ng/mL (ref 0.5–1.9)

## 2014-04-22 LAB — SODIUM
Sodium: 125 mEq/L — ABNORMAL LOW (ref 135–145)
Sodium: 131 mEq/L — ABNORMAL LOW (ref 135–145)

## 2014-04-22 LAB — CALCIUM: Calcium: 9.2 mg/dL (ref 8.9–10.2)

## 2014-04-22 LAB — POTASSIUM, WBLD
Potassium: 3.1 mEq/L — ABNORMAL LOW (ref 3.7–5.2)
Potassium: 3.6 mEq/L — ABNORMAL LOW (ref 3.7–5.2)

## 2014-04-22 LAB — CALCIUM, (REFLEXIVE IONIZED)

## 2014-04-22 LAB — CALCIUM (IONIZED), WB: Calcium (Ionized): 1.18 mmol/L (ref 1.18–1.38)

## 2014-04-22 LAB — PHOSPHATE: Phosphate: 5 mg/dL — ABNORMAL HIGH (ref 2.5–4.5)

## 2014-04-23 LAB — COOXIMETRY PANEL, MIXED VEN
Carboxyhemoglobin, Mixed VEN: 1.5 %
Hemoglobin, MIXED VEN: 7.9 g/dL — ABNORMAL LOW (ref 13.0–18.0)
Methemoglobin, Mixed VEN: 0.7 % (ref <3.0)
O2 Content, Mixed VEN: 5.8 VOL%
O2 Sat (Frac.), Mixed VEN: 52 %
O2 Sat (Func.), Mixed VEN: 53 %

## 2014-04-23 LAB — BASIC METABOLIC PANEL
Anion Gap: 12 (ref 4–12)
Anion Gap: 12 (ref 4–12)
Calcium: 9.2 mg/dL (ref 8.9–10.2)
Calcium: 9.3 mg/dL (ref 8.9–10.2)
Carbon Dioxide, Total: 25 mEq/L (ref 22–32)
Carbon Dioxide, Total: 23 mEq/L (ref 22–32)
Chloride: 92 mEq/L — ABNORMAL LOW (ref 98–108)
Chloride: 91 mEq/L — ABNORMAL LOW (ref 98–108)
Creatinine: 1.74 mg/dL — ABNORMAL HIGH (ref 0.51–1.18)
Creatinine: 1.63 mg/dL — ABNORMAL HIGH (ref 0.51–1.18)
GFR, Calc, African American: 48 mL/min — ABNORMAL LOW (ref >59)
GFR, Calc, African American: 52 mL/min — ABNORMAL LOW (ref >59)
GFR, Calc, European American: 40 mL/min — ABNORMAL LOW (ref >59)
GFR, Calc, European American: 43 mL/min — ABNORMAL LOW (ref >59)
Glucose: 120 mg/dL (ref 62–125)
Glucose: 93 mg/dL (ref 62–125)
Potassium: 3.7 mEq/L (ref 3.6–5.2)
Potassium: 5.5 mEq/L — ABNORMAL HIGH (ref 3.6–5.2)
Sodium: 129 mEq/L — ABNORMAL LOW (ref 135–145)
Sodium: 126 mEq/L — ABNORMAL LOW (ref 135–145)
Urea Nitrogen: 38 mg/dL — ABNORMAL HIGH (ref 8–21)
Urea Nitrogen: 36 mg/dL — ABNORMAL HIGH (ref 8–21)

## 2014-04-23 LAB — PARTIAL THROMBOPLASTIN TIME: Partial Thromboplastin Time: 46 s — ABNORMAL HIGH (ref 22–35)

## 2014-04-23 LAB — SODIUM
Sodium: 127 mEq/L — ABNORMAL LOW (ref 135–145)
Sodium: 127 mEq/L — ABNORMAL LOW (ref 135–145)

## 2014-04-23 LAB — CBC (HEMOGRAM)
Hematocrit: 23 % — ABNORMAL LOW (ref 38–50)
Hemoglobin: 7.5 g/dL — ABNORMAL LOW (ref 13.0–18.0)
MCH: 31.6 pg (ref 27.3–33.6)
MCHC: 32.9 g/dL (ref 32.2–36.5)
MCV: 96 fL (ref 81–98)
Platelet Count: 132 10*3/uL — ABNORMAL LOW (ref 150–400)
RBC: 2.37 mil/uL — ABNORMAL LOW (ref 4.40–5.60)
RDW-CV: 19 % — ABNORMAL HIGH (ref 11.6–14.4)
WBC: 10.02 10*3/uL — ABNORMAL HIGH (ref 4.3–10.0)

## 2014-04-23 LAB — PROTHROMBIN TIME
Prothrombin INR: 1.3 (ref 0.8–1.3)
Prothrombin Time Patient: 15.3 s (ref 10.7–15.6)

## 2014-04-23 LAB — POTASSIUM, SERUM
Potassium: 3.5 mEq/L — ABNORMAL LOW (ref 3.6–5.2)
Potassium: 4.1 mEq/L (ref 3.6–5.2)

## 2014-04-24 DIAGNOSIS — I5043 Acute on chronic combined systolic (congestive) and diastolic (congestive) heart failure: Secondary | ICD-10-CM

## 2014-04-24 DIAGNOSIS — E8779 Other fluid overload: Secondary | ICD-10-CM

## 2014-04-24 LAB — BASIC METABOLIC PANEL
Anion Gap: 11 (ref 4–12)
Anion Gap: 8 (ref 4–12)
Anion Gap: 13 — ABNORMAL HIGH (ref 4–12)
Calcium: 8.7 mg/dL — ABNORMAL LOW (ref 8.9–10.2)
Calcium: 8.6 mg/dL — ABNORMAL LOW (ref 8.9–10.2)
Calcium: 8.9 mg/dL (ref 8.9–10.2)
Carbon Dioxide, Total: 25 mEq/L (ref 22–32)
Carbon Dioxide, Total: 27 mEq/L (ref 22–32)
Carbon Dioxide, Total: 23 mEq/L (ref 22–32)
Chloride: 89 mEq/L — ABNORMAL LOW (ref 98–108)
Chloride: 92 mEq/L — ABNORMAL LOW (ref 98–108)
Chloride: 92 mEq/L — ABNORMAL LOW (ref 98–108)
Creatinine: 1.68 mg/dL — ABNORMAL HIGH (ref 0.51–1.18)
Creatinine: 1.64 mg/dL — ABNORMAL HIGH (ref 0.51–1.18)
Creatinine: 1.6 mg/dL — ABNORMAL HIGH (ref 0.51–1.18)
GFR, Calc, African American: 50 mL/min — ABNORMAL LOW (ref >59)
GFR, Calc, African American: 52 mL/min — ABNORMAL LOW (ref >59)
GFR, Calc, African American: 53 mL/min — ABNORMAL LOW (ref >59)
GFR, Calc, European American: 42 mL/min — ABNORMAL LOW (ref >59)
GFR, Calc, European American: 43 mL/min — ABNORMAL LOW (ref >59)
GFR, Calc, European American: 44 mL/min — ABNORMAL LOW (ref >59)
Glucose: 128 mg/dL — ABNORMAL HIGH (ref 62–125)
Glucose: 147 mg/dL — ABNORMAL HIGH (ref 62–125)
Glucose: 144 mg/dL — ABNORMAL HIGH (ref 62–125)
Potassium: 3.6 mEq/L (ref 3.6–5.2)
Potassium: 3.5 mEq/L — ABNORMAL LOW (ref 3.6–5.2)
Potassium: 4.5 mEq/L (ref 3.6–5.2)
Sodium: 125 mEq/L — ABNORMAL LOW (ref 135–145)
Sodium: 127 mEq/L — ABNORMAL LOW (ref 135–145)
Sodium: 128 mEq/L — ABNORMAL LOW (ref 135–145)
Urea Nitrogen: 37 mg/dL — ABNORMAL HIGH (ref 8–21)
Urea Nitrogen: 34 mg/dL — ABNORMAL HIGH (ref 8–21)
Urea Nitrogen: 36 mg/dL — ABNORMAL HIGH (ref 8–21)

## 2014-04-24 LAB — MAGNESIUM: Magnesium: 2 mg/dL (ref 1.8–2.4)

## 2014-04-24 LAB — HEPATIC FUNCTION PANEL
ALT (GPT): 13 U/L (ref 10–48)
AST (GOT): 20 U/L (ref 9–38)
Albumin: 3.6 g/dL (ref 3.5–5.2)
Alkaline Phosphatase (Total): 110 U/L (ref 37–159)
Bilirubin (Direct): 0.8 mg/dL — ABNORMAL HIGH (ref 0.0–0.3)
Bilirubin (Total): 1.5 mg/dL — ABNORMAL HIGH (ref 0.2–1.3)
Protein (Total): 6.3 g/dL (ref 6.0–8.2)

## 2014-04-24 LAB — COOXIMETRY PANEL, MIXED VEN
Carboxyhemoglobin, Mixed VEN: 1.5 %
Hemoglobin, MIXED VEN: 7.9 g/dL — ABNORMAL LOW (ref 13.0–18.0)
Methemoglobin, Mixed VEN: 0.8 % (ref <3.0)
O2 Content, Mixed VEN: 6.4 VOL%
O2 Sat (Frac.), Mixed VEN: 58 %
O2 Sat (Func.), Mixed VEN: 59 %

## 2014-04-24 LAB — CALCIUM, (REFLEXIVE IONIZED)

## 2014-04-24 LAB — PARTIAL THROMBOPLASTIN TIME: Partial Thromboplastin Time: 52 s — ABNORMAL HIGH (ref 22–35)

## 2014-04-24 LAB — CBC (HEMOGRAM)
Hematocrit: 23 % — ABNORMAL LOW (ref 38–50)
Hemoglobin: 7.6 g/dL — ABNORMAL LOW (ref 13.0–18.0)
MCH: 31.7 pg (ref 27.3–33.6)
MCHC: 33 g/dL (ref 32.2–36.5)
MCV: 96 fL (ref 81–98)
Platelet Count: 156 10*3/uL (ref 150–400)
RBC: 2.4 mil/uL — ABNORMAL LOW (ref 4.40–5.60)
RDW-CV: 18.8 % — ABNORMAL HIGH (ref 11.6–14.4)
WBC: 10.82 10*3/uL — ABNORMAL HIGH (ref 4.3–10.0)

## 2014-04-24 LAB — POTASSIUM, SERUM: Potassium: 3.5 mEq/L — ABNORMAL LOW (ref 3.6–5.2)

## 2014-04-24 LAB — PHOSPHATE: Phosphate: 4.1 mg/dL (ref 2.5–4.5)

## 2014-04-24 LAB — PROTHROMBIN TIME
Prothrombin INR: 1.4 — ABNORMAL HIGH (ref 0.8–1.3)
Prothrombin Time Patient: 16.1 s — ABNORMAL HIGH (ref 10.7–15.6)

## 2014-04-24 LAB — HEMATOCRIT: Hematocrit: 25 % — ABNORMAL LOW (ref 38–50)

## 2014-04-25 LAB — CBC (HEMOGRAM)
Hematocrit: 24 % — ABNORMAL LOW (ref 38–50)
Hemoglobin: 7.8 g/dL — ABNORMAL LOW (ref 13.0–18.0)
MCH: 31.8 pg (ref 27.3–33.6)
MCHC: 33.1 g/dL (ref 32.2–36.5)
MCV: 96 fL (ref 81–98)
Platelet Count: 160 10*3/uL (ref 150–400)
RBC: 2.45 mil/uL — ABNORMAL LOW (ref 4.40–5.60)
RDW-CV: 18.7 % — ABNORMAL HIGH (ref 11.6–14.4)
WBC: 10.54 10*3/uL — ABNORMAL HIGH (ref 4.3–10.0)

## 2014-04-25 LAB — POTASSIUM, WBLD: Potassium: 3.2 mEq/L — ABNORMAL LOW (ref 3.7–5.2)

## 2014-04-25 LAB — PROTHROMBIN & PTT
Partial Thromboplastin Time: 38 s — ABNORMAL HIGH (ref 22–35)
Prothrombin INR: 1.3 (ref 0.8–1.3)
Prothrombin Time Patient: 15.7 s — ABNORMAL HIGH (ref 10.7–15.6)

## 2014-04-25 LAB — COOXIMETRY PANEL, MIXED VEN
Carboxyhemoglobin, Mixed VEN: 1.7 %
Hemoglobin, MIXED VEN: 8 g/dL — ABNORMAL LOW (ref 13.0–18.0)
Methemoglobin, Mixed VEN: 0.8 % (ref <3.0)
O2 Content, Mixed VEN: 7.9 VOL%
O2 Sat (Frac.), Mixed VEN: 69 %
O2 Sat (Func.), Mixed VEN: 71 %

## 2014-04-25 LAB — BASIC METABOLIC PANEL
Anion Gap: 13 — ABNORMAL HIGH (ref 4–12)
Calcium: 9 mg/dL (ref 8.9–10.2)
Carbon Dioxide, Total: 24 mEq/L (ref 22–32)
Chloride: 91 mEq/L — ABNORMAL LOW (ref 98–108)
Creatinine: 1.55 mg/dL — ABNORMAL HIGH (ref 0.51–1.18)
GFR, Calc, African American: 55 mL/min — ABNORMAL LOW (ref >59)
GFR, Calc, European American: 46 mL/min — ABNORMAL LOW (ref >59)
Glucose: 125 mg/dL (ref 62–125)
Potassium: 3.2 mEq/L — ABNORMAL LOW (ref 3.6–5.2)
Sodium: 128 mEq/L — ABNORMAL LOW (ref 135–145)
Urea Nitrogen: 37 mg/dL — ABNORMAL HIGH (ref 8–21)

## 2014-04-25 LAB — POTASSIUM, SERUM: Potassium: 6.4 mEq/L (ref 3.6–5.2)

## 2014-05-23 DEATH — deceased

## 2014-07-05 ENCOUNTER — Telehealth: Payer: Self-pay

## 2014-07-05 NOTE — Telephone Encounter (Signed)
Left message to call me back he needs an appointment for eval. For surgery

## 2014-07-06 ENCOUNTER — Encounter: Payer: Self-pay | Admitting: Cardiology

## 2014-07-06 ENCOUNTER — Encounter: Payer: Self-pay | Admitting: Family Medicine

## 2014-07-06 ENCOUNTER — Other Ambulatory Visit: Payer: Self-pay | Admitting: Cardiology

## 2014-07-06 ENCOUNTER — Ambulatory Visit (INDEPENDENT_AMBULATORY_CARE_PROVIDER_SITE_OTHER): Payer: Federal, State, Local not specified - PPO | Admitting: Cardiology

## 2014-07-06 ENCOUNTER — Ambulatory Visit (INDEPENDENT_AMBULATORY_CARE_PROVIDER_SITE_OTHER): Payer: Federal, State, Local not specified - PPO | Admitting: Family Medicine

## 2014-07-06 VITALS — BP 138/90 | HR 62 | Ht 68.0 in | Wt 186.8 lb

## 2014-07-06 VITALS — BP 114/70 | Wt 186.0 lb

## 2014-07-06 DIAGNOSIS — N529 Male erectile dysfunction, unspecified: Secondary | ICD-10-CM

## 2014-07-06 DIAGNOSIS — I4891 Unspecified atrial fibrillation: Secondary | ICD-10-CM

## 2014-07-06 DIAGNOSIS — M1711 Unilateral primary osteoarthritis, right knee: Secondary | ICD-10-CM

## 2014-07-06 DIAGNOSIS — Z23 Encounter for immunization: Secondary | ICD-10-CM

## 2014-07-06 DIAGNOSIS — Z01810 Encounter for preprocedural cardiovascular examination: Secondary | ICD-10-CM

## 2014-07-06 DIAGNOSIS — M129 Arthropathy, unspecified: Secondary | ICD-10-CM

## 2014-07-06 MED ORDER — VARDENAFIL HCL 20 MG PO TABS: 20.0000 mg | ORAL_TABLET | Freq: Every day | ORAL | Status: DC | PRN

## 2014-07-06 MED ORDER — DILTIAZEM HCL ER COATED BEADS 120 MG PO CP24: 120.0000 mg | ORAL_CAPSULE | Freq: Every day | ORAL | Status: DC

## 2014-07-06 NOTE — Progress Notes (Signed)
   Subjective:    Patient ID: Matthew Mason, male    DOB: 01/27/1951, 64 y.o.   MRN: [id number]  HPI He is here for preoperative evaluation. He is getting ready to have a partial knee replacement. He is having a lot of medial right joint line tenderness. This is interfering with the quality of his life. He has had difficulty in the past with atrial fibrillation however he saw his cardiologist recently and his medications were readjusted. He seems to be doing well with this. He will continue on his diltiazem until after the surgery. He is also interested in getting medication for erectile dysfunction. He states that at least 50% a time he has difficulty getting and maintaining an erection. He does not smoke and rarely drinks. He is on no other medications. His marriage is going well.   Review of Systems     Objective:   Physical Exam alert and in no distress. Tympanic membranes and canals are normal. Throat is clear. Tonsils are normal. Neck is supple without adenopathy or thyromegaly. Cardiac exam shows a regular sinus rhythm without murmurs or gallops. Lungs are clear to auscultation.        Assessment & Plan:  Arthritis of right knee  Need for prophylactic vaccination and inoculation against influenza - Plan: Flu Vaccine QUAD 36+ mos PF IM (Fluarix Quad PF)  Atrial fibrillation, unspecified  Erectile dysfunction, unspecified erectile dysfunction type  discussed his impending surgery. Reaffirmed the fact that I think this is a good step for him. His flu shot was given. Discussed eat D with him. I will give him Levitra samples. Discussed possible side effects. He will let me know if the [place] works.

## 2014-07-06 NOTE — Patient Instructions (Signed)
Your physician wants you to follow-up in: 1 Year You will receive a reminder letter in the mail two months in advance. If you don't receive a letter, please call our office to schedule the follow-up appointment.  Your physician has recommended you make the following change in your medication: Take Diltiazem 5 days before surgery and 5 days after Surgery, then take as needed.

## 2014-07-06 NOTE — Telephone Encounter (Signed)
Rx(s) sent to pharmacy electronically.  

## 2014-07-07 ENCOUNTER — Telehealth: Payer: Self-pay | Admitting: Family Medicine

## 2014-07-07 ENCOUNTER — Encounter: Payer: Self-pay | Admitting: Cardiology

## 2014-07-07 DIAGNOSIS — Z01810 Encounter for preprocedural cardiovascular examination: Secondary | ICD-10-CM | POA: Insufficient documentation

## 2014-07-07 NOTE — Telephone Encounter (Signed)
lm

## 2014-07-07 NOTE — Progress Notes (Signed)
PCP: Wyatt Haste, MD  Clinic Note: Chief Complaint  Patient presents with  . Follow-up    1 year. no complaints.  . Atrial Fibrillation  . Pre-op Exam   HPI: Matthew Mason is a 64 y.o. male with a PMH below who presents today for delayed annual followup of his paroxysmal atrial fibrillation which may actually be considered [place] Atrial fibrillation. He was admitted to the hospital for an overnight stay he presented with rapid A. Fib. Easily controlled and spontaneous converted with a diltiazem. He was placed on initially standing diltiazem but then became when necessary. When a monitor showed frequent recurrences of A. Fib, we decided to initially anticoagulate with Eliquis. However last several years he is not had any recurrences, therefore he stopped taking Eliquis.  Due to have Right knee partial arthroplasty.  Past Medical History  Diagnosis Date  . Personal history of colonic polyps   . Hemorrhoids   . Paroxysmal atrial fibrillation 2012    Several short episodes on Event Monitor aftrer initial hospitalization. -- On PRN Diltiazem & ASA    Prior Cardiac Evaluation and Past Surgical History: Past Surgical History  Procedure Laterality Date  . Colonoscopy  01/07    Dr. Olevia Perches  . Nm myoview ltd  June 2012    Treadmill: Walks 11.5 minutes, 12 METs, no ischemia or infarction  . Transthoracic echocardiogram  June 2012     Normal LV size and function, EF>55%,Gr 1 DD, no regional WMA, Trace MR.   Interval History: he continues to outstanding the well with no recurrence whatsoever of A. Fib. Absolutely no symptoms to speak of.  No recurrent episodes of atrial fibrillation that he can tell ever since I last saw him. He's been bothered by his right knee more so than usual and has seen Dr. Noemi Chapel, and is considering partial arthroplasty. He denies any anginal or heart failure symptoms: No chest pain or shortness of breath with rest or exertion. No PND, orthopnea or edema.  No  palpitations, lightheadedness, dizziness, weakness or syncope/near syncope. No TIA/amaurosis fugax symptoms.   ROS: A comprehensive was performed. Review of Systems  Respiratory: Negative for cough, shortness of breath and wheezing.   Cardiovascular: Negative for palpitations and claudication.  Gastrointestinal: Negative for blood in stool and melena.  Genitourinary: Negative for hematuria.  Endo/Heme/Allergies: Does not bruise/bleed easily.  Psychiatric/Behavioral: Negative for depression. The patient is not nervous/anxious.   All other systems reviewed and are negative.   No current outpatient prescriptions on file prior to visit.   No current facility-administered medications on file prior to visit.   No Known Allergies  Current outpatient prescriptions:  .  aspirin 81 MG tablet, Take 81 mg by mouth daily., Disp: , Rfl:  .  diltiazem (CARDIZEM) 90 MG tablet, Take 90 mg by mouth daily. PRN, Disp: , Rfl:  .  vardenafil (LEVITRA) 20 MG tablet, Take 1 tablet (20 mg total) by mouth daily as needed for erectile dysfunction., Disp: 2 tablet, Rfl: 0  ** NOT TAKING ANY OF THESE MEDICATIONS   Wt Readings from Last 3 Encounters:  07/06/14 186 lb (84.369 kg)  07/06/14 186 lb 12.8 oz (84.732 kg)  01/26/14 180 lb (81.647 kg)    PHYSICAL EXAM BP 138/90 mmHg  Pulse 62  Ht 5\' 8"  (1.727 m)  Wt 186 lb 12.8 oz (84.732 kg)  BMI 28.41 kg/m2 General appearance: alert, cooperative, appears stated age, no distress and "mildly overweight", but mostly healthy-appearing well-groomed gentleman Neck: no adenopathy,  no carotid bruit and no JVD Lungs: clear to auscultation bilaterally, normal percussion bilaterally and non-labored Heart: regular rate and rhythm, S1&S2 normal, no murmur, click, rub or gallop; nondisplaced PMI Abdomen: soft, non-tender; bowel sounds normal; no masses,  no organomegaly;  Extremities: extremities normal, atraumatic, no cyanosis, or edema  Pulses: 2+ and symmetric;    Skin: mobility and turgor normal  Neurologic: Mental status: Alert, oriented, thought content appropriate; Cranial nerves: normal (II-XII grossly intact)   Adult ECG Report  Rate: 62 ;  Rhythm: normal sinus rhythm and Normal axis, intervals, durations, and voltage.  Narrative Interpretation: normal EKG  ASSESSMENT / PLAN: Preoperative cardiovascular examination Plan surgery as a low risk of knee surgery with no intraperitoneal or intrathoracic involvement. He has no active cardiac symptoms but does have history of atrial fibrillation.. No diabetes, stroke or peripheral vascular disease. No heart failure.  With no active cardiac conditions, he would be a low risk patient for a low risk surgery. No further cardiac evaluation is warranted.  In order to prophylactically compare for possible perioperative A. Fib, I recommended that he starts taking diltiazem for a week before and week after the surgery. Take Diltiazem 5 days before surgery and 5 days after Surgery, then take as needed.    Atrial fibrillation, unspecified No recurrent symptoms as far as I can tell. Continue with when necessary diltiazem. I decreased it to 90 mg from 120 mg. Prophylaxis with aspirin.  Okay to have stopped Eliquis.     No orders of the defined types were placed in this encounter.   Meds ordered this encounter  Medications  . DISCONTD: diltiazem (CARDIZEM CD) 120 MG 24 hr capsule    Sig: Take 1 capsule (120 mg total) by mouth daily.    Dispense:  30 capsule    Refill:  0    Followup: one year   Naevia Unterreiner, Leonie Green, M.D., M.S. Interventional Cardiologist   Pager # [phone]

## 2014-07-07 NOTE — Assessment & Plan Note (Signed)
No recurrent symptoms as far as I can tell. Continue with when necessary diltiazem. I decreased it to 90 mg from 120 mg. Prophylaxis with aspirin.  Okay to have stopped Eliquis.

## 2014-07-07 NOTE — Assessment & Plan Note (Signed)
Plan surgery as a low risk of knee surgery with no intraperitoneal or intrathoracic involvement. He has no active cardiac symptoms but does have history of atrial fibrillation.. No diabetes, stroke or peripheral vascular disease. No heart failure.  With no active cardiac conditions, he would be a low risk patient for a low risk surgery. No further cardiac evaluation is warranted.  In order to prophylactically compare for possible perioperative A. Fib, I recommended that he starts taking diltiazem for a week before and week after the surgery. Take Diltiazem 5 days before surgery and 5 days after Surgery, then take as needed.

## 2014-07-12 NOTE — Addendum Note (Signed)
Addended by: Vear Clock on: 07/12/2014 12:11 PM   Modules accepted: Orders

## 2014-07-13 ENCOUNTER — Encounter: Payer: Self-pay | Admitting: Cardiology

## 2014-07-13 ENCOUNTER — Telehealth: Payer: Self-pay | Admitting: *Deleted

## 2014-07-13 NOTE — Telephone Encounter (Signed)
CLEARANCE SENT TO DR Noemi Chapel- FOR LEFT PARTIAL KNEE REPLACEMENT - OKAY TO STOP ASPIRIN 4-5 DAYS PRIOR TO SURGERY PER DR HARDING.

## 2014-07-21 ENCOUNTER — Telehealth: Payer: Self-pay | Admitting: Family Medicine

## 2014-07-21 DIAGNOSIS — N529 Male erectile dysfunction, unspecified: Secondary | ICD-10-CM

## 2014-07-21 NOTE — Telephone Encounter (Signed)
Pt states he ws given samples of levetra and would like a rx called into [facility] on [address]. Pt can be reached at [phone]

## 2014-07-23 MED ORDER — VARDENAFIL HCL 20 MG PO TABS: 20.0000 mg | ORAL_TABLET | Freq: Every day | ORAL | Status: DC | PRN

## 2014-07-24 IMAGING — US US ABDOMEN COMPLETE
1 series · 13 of 25 positions shown · non-contrast
Comparison: None.

CLINICAL DATA: Elevated liver function tests

COMPLETE ABDOMINAL ULTRASOUND

[Series 1: us abdomen complete · 0.20mm/px · 13 of 100 slices shown]
[im 1/100]
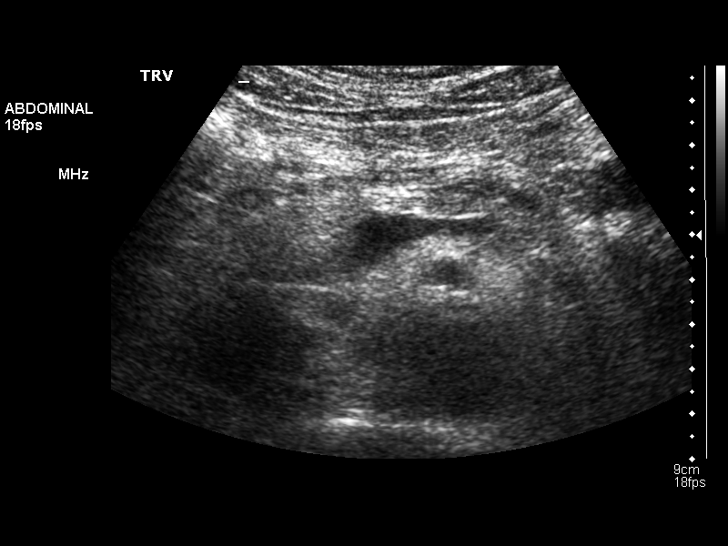
[im 9/100]
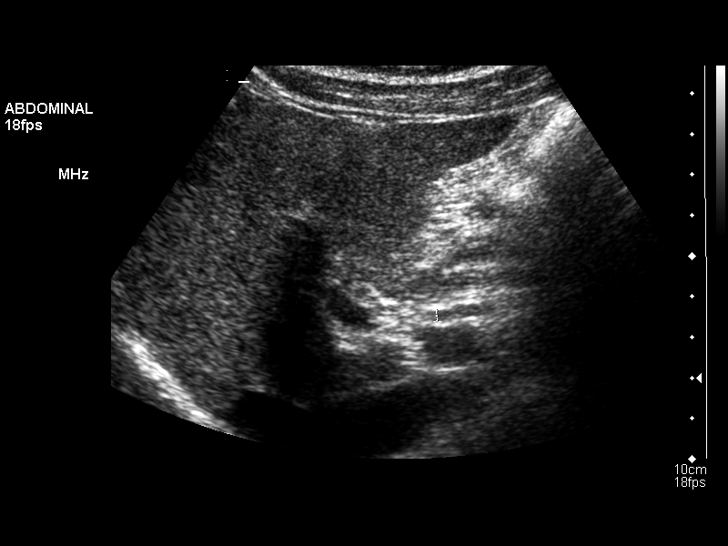
[im 17/100]
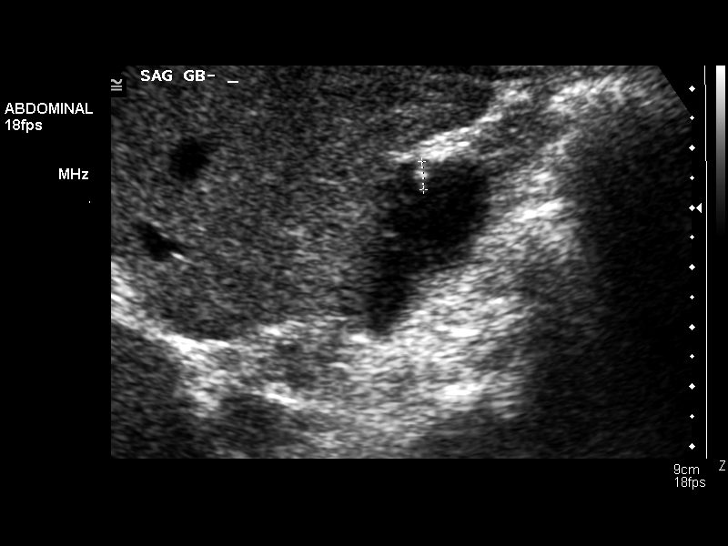
[im 25/100]
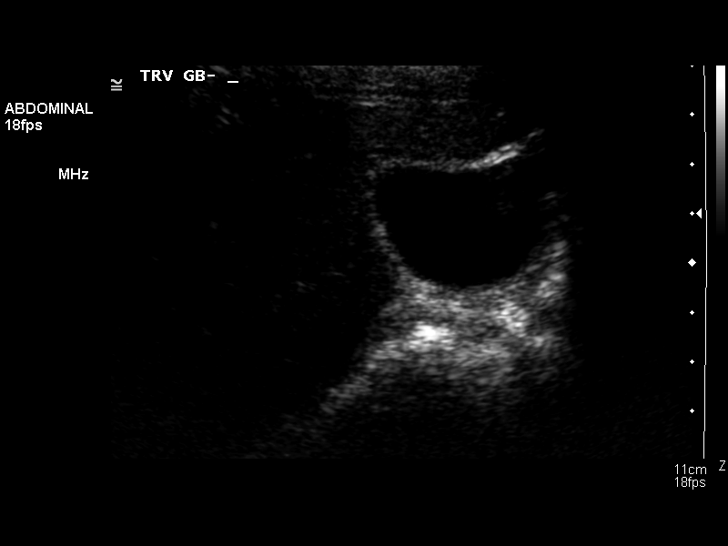
[im 34/100]
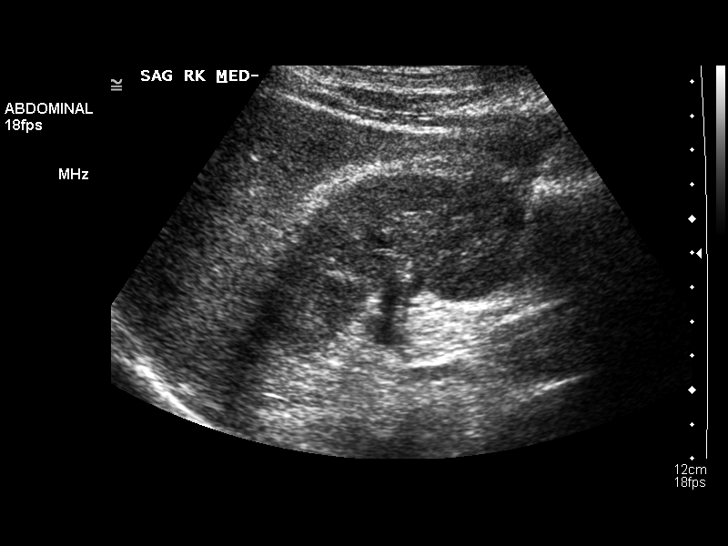
[im 42/100]
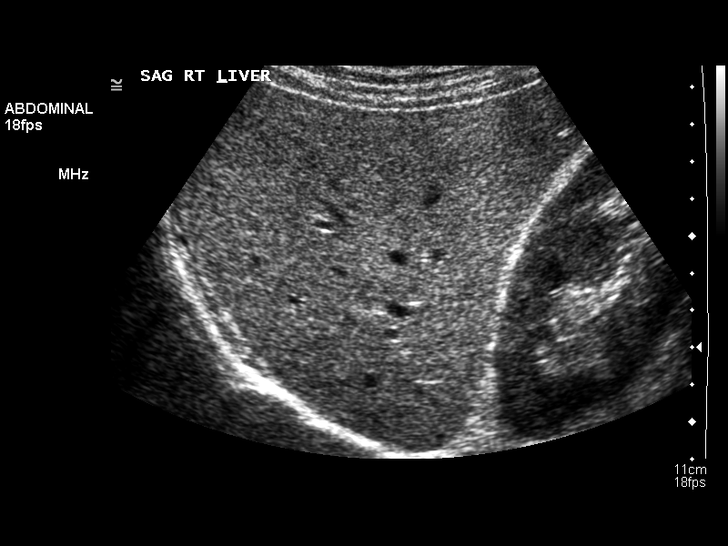
[im 50/100]
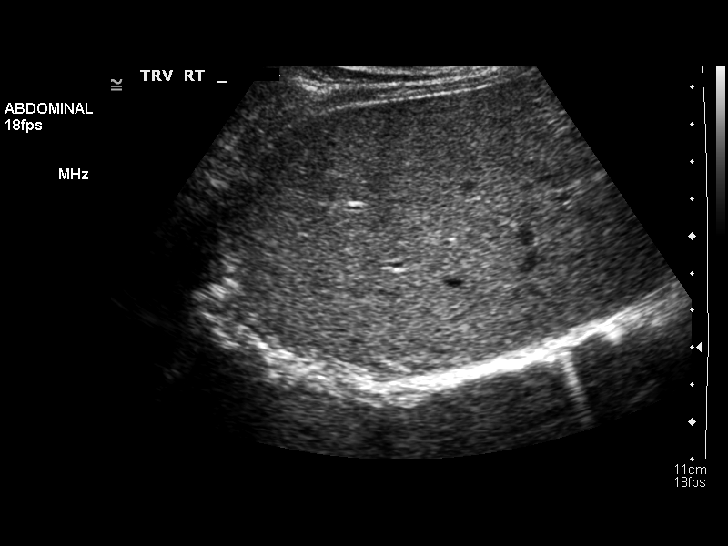
[im 58/100]
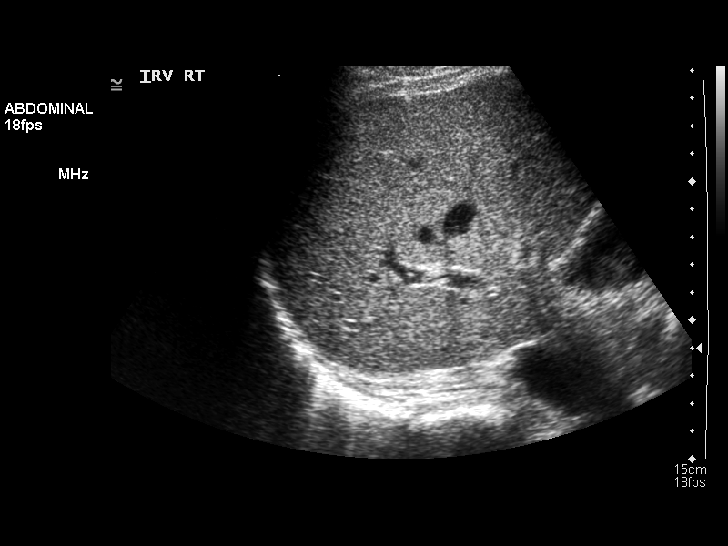
[im 67/100]
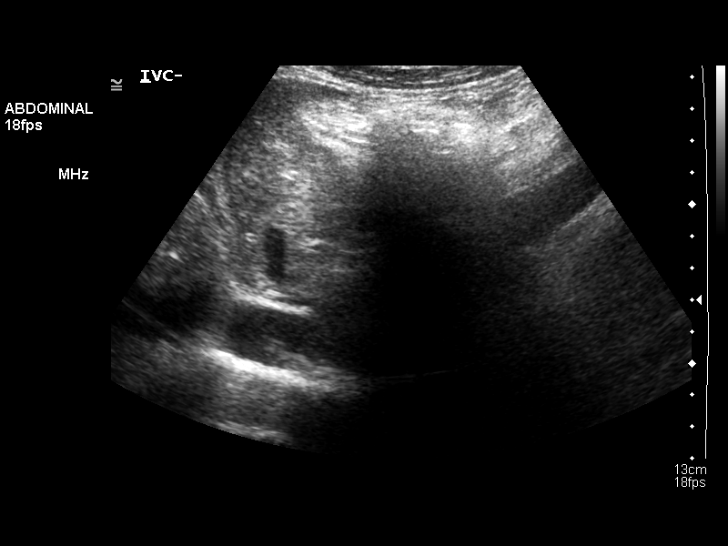
[im 75/100]
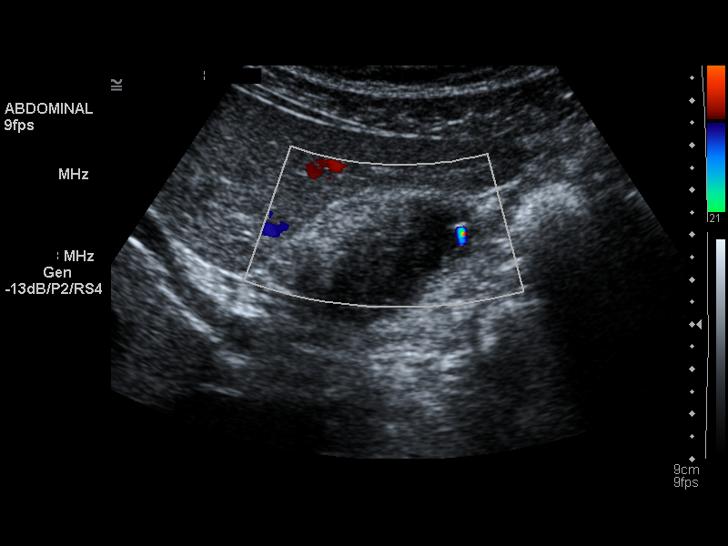
[im 83/100]
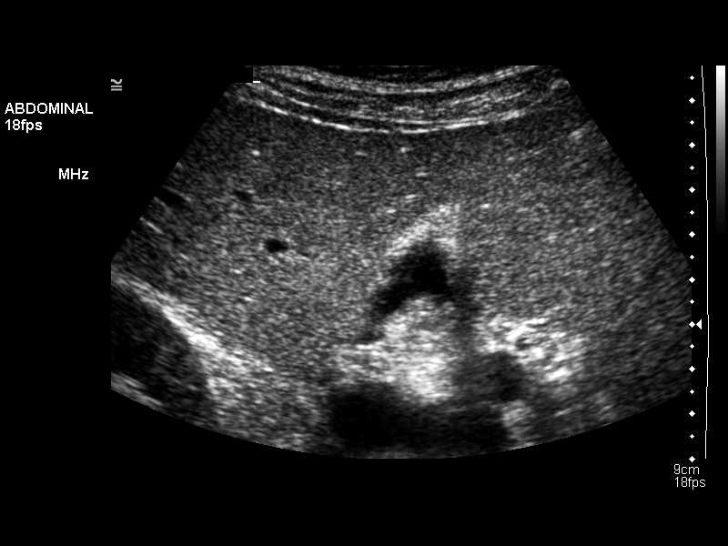
[im 91/100]
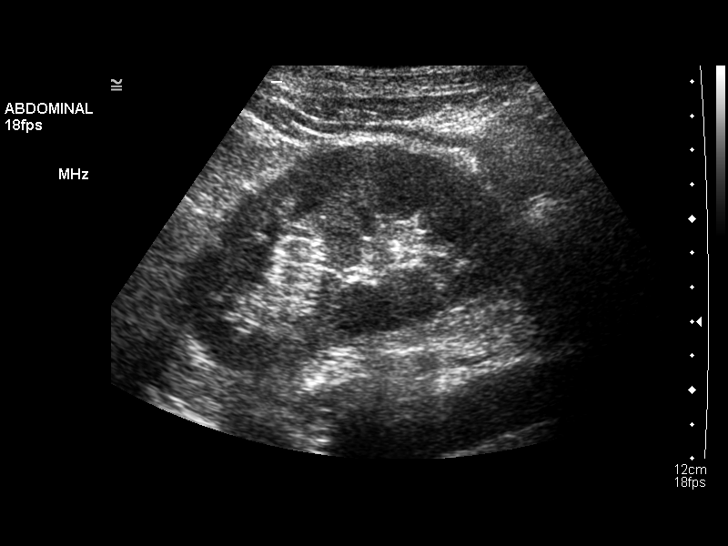
[im 100/100]
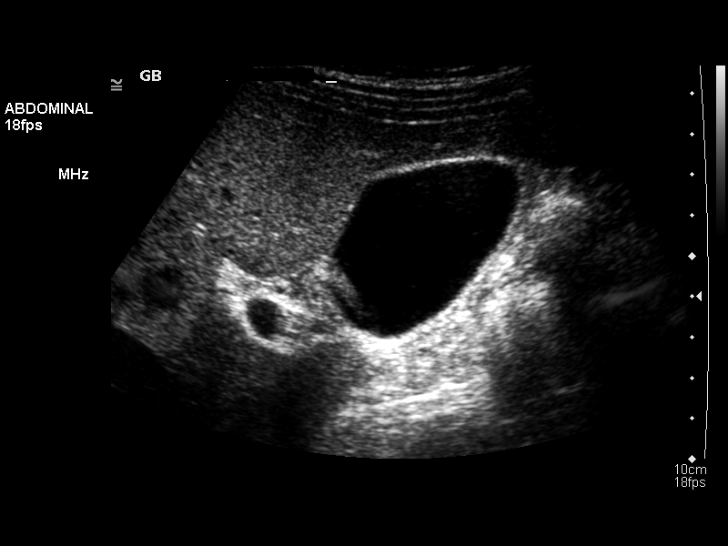

[13 of 25 positions shown; findings below may reference images not displayed]

FINDINGS: Gallbladder:  There are at least three gallbladder polyps present
which are not mobile.  No definite gallstones are seen.  These
polyps measure no more than 5 mm in diameter.  No pain is present
over the gallbladder with compression.

Common bile duct:  The common bile duct is normal measuring 2.8 mm
in diameter distally.

Liver:  The liver has a normal echogenic pattern.  No ductal
dilatation is seen.

IVC:  The IVC is obscured by bowel gas.

Pancreas:  The pancreas also is largely obscured by bowel gas with
of the midportion being visualized and appearing normal.

Spleen:  The spleen is normal measuring 3.6 cm sagittally.

Right Kidney:  No hydronephrosis is seen.  The right kidney
measures 11.6 cm sagittally.

Left Kidney:  No hydronephrosis is noted.  The left kidney measures
10.4 cm.

Abdominal aorta:  The abdominal aorta is normal in caliber.
IMPRESSION: 1.  At least three gallbladder polyps.  No gallstones or pain over
the gallbladder.
2.  The liver parenchyma appears normal.  No ductal dilatation is
seen.
3.  Portions of pancreas are obscured by bowel gas

## 2014-08-17 ENCOUNTER — Other Ambulatory Visit: Payer: Self-pay | Admitting: Orthopedic Surgery

## 2014-08-17 ENCOUNTER — Other Ambulatory Visit: Payer: Self-pay | Admitting: Physician Assistant

## 2014-08-17 NOTE — H&P (Addendum)
TOTAL KNEE ADMISSION H&P  Patient is being admitted for right unicompartmental total knee arthroplasty.  Subjective:  Chief Complaint:left knee pain.  HPI: Matthew Mason, 64 y.o. male, has a history of pain and functional disability in the left knee due to arthritis and has failed non-surgical conservative treatments for greater than 12 weeks to includeNSAID's and/or analgesics, corticosteriod injections and activity modification.  Onset of symptoms was gradual, starting 10 years ago with gradually worsening course since that time. The patient noted prior procedures on the knee to include  arthroscopy and menisectomy on the right knee(s).  Patient currently rates pain in the right knee(s) at 8 out of 10 with activity. Patient has night pain, worsening of pain with activity and weight bearing and pain that interferes with activities of daily living.  Patient has evidence of joint space narrowing by imaging studies. This patient has had mri showing medial compartment primary localized arthritis with only minimal changes laterally and patellofemoral. There is no active infection.  Patient Active Problem List   Diagnosis Date Noted  . Preoperative cardiovascular examination 07/07/2014  . Atrial fibrillation, unspecified 01/26/2014  . Personal history of colonic polyps 01/26/2014  . Bilateral hearing loss 01/26/2014   Past Medical History  Diagnosis Date  . Personal history of colonic polyps   . Hemorrhoids   . Paroxysmal atrial fibrillation 2012    Several short episodes on Event Monitor aftrer initial hospitalization. -- On PRN Diltiazem & ASA    Past Surgical History  Procedure Laterality Date  . Colonoscopy  01/07    Dr. Olevia Perches  . Nm myoview ltd  June 2012    Treadmill: Walks 11.5 minutes, 12 METs, no ischemia or infarction  . Transthoracic echocardiogram  June 2012     Normal LV size and function, EF>55%,Gr 1 DD, no regional WMA, Trace MR.     (Not in a hospital admission) No  Known Allergies  History  Substance Use Topics  . Smoking status: Former Smoker    Quit date: 06/23/1998  . Smokeless tobacco: Never Used  . Alcohol Use: 15.0 oz/week    25 Cans of beer per week    Family History  Problem Relation Age of Onset  . Heart disease Mother     Arrhythmia with ablation  . Heart disease Father 85    MI  . Cancer Brother 15    Prostate cancer     Review of Systems  Cardiovascular: Positive for palpitations.       History of atrial fibrillation  Musculoskeletal: Positive for joint pain.  All other systems reviewed and are negative.   Objective:  Physical Exam  Constitutional: He is oriented to person, place, and time. He appears well-developed and well-nourished.  HENT:  Head: Normocephalic and atraumatic.  Eyes: EOM are normal. Pupils are equal, round, and reactive to light.  Neck: Normal range of motion. Neck supple.  Cardiovascular: Normal rate and regular rhythm.   Respiratory: Effort normal and breath sounds normal.  GI: Soft. Bowel sounds are normal.  Musculoskeletal: Normal range of motion.       Right knee: He exhibits swelling. Tenderness found.  Neurological: He is alert and oriented to person, place, and time.  Skin: Skin is warm and dry.  Psychiatric: He has a normal mood and affect.    Vital signs in last 24 hours: @VSRANGES @  Labs:   Estimated body mass index is 28.29 kg/(m^2) as calculated from the following:   Height as of 07/06/14: 5\' 8"  (  1.727 m).   Weight as of 07/06/14: 84.369 kg (186 lb).   Imaging Review Plain radiographs demonstrate moderate degenerative joint disease of the right knee(s). The overall alignment isneutral. The bone quality appears to be adequate for age and reported activity level.  Assessment/Plan:  End stage arthritis, right knee medial compartment  The patient history, physical examination, clinical judgment of the provider and imaging studies are consistent with end stage degenerative joint  disease of the right knee(s) and total knee arthroplasty is deemed medically necessary. The treatment options including medical management, injection therapy arthroscopy and arthroplasty were discussed at length. The risks and benefits of total knee arthroplasty were presented and reviewed. The risks due to aseptic loosening, infection, stiffness, patella tracking problems, thromboembolic complications and other imponderables were discussed. The patient acknowledged the explanation, agreed to proceed with the plan and consent was signed. Patient is being admitted for inpatient treatment for surgery, pain control, PT, OT, prophylactic antibiotics, VTE prophylaxis, progressive ambulation and ADL's and discharge planning. The patient is planning to be discharged home with home health services

## 2014-08-18 ENCOUNTER — Telehealth: Payer: Self-pay | Admitting: Cardiology

## 2014-08-18 ENCOUNTER — Encounter (HOSPITAL_COMMUNITY)
Admission: RE | Admit: 2014-08-18 | Discharge: 2014-08-18 | Disposition: A | Discharge status: Home / Self Care | Payer: Federal, State, Local not specified - PPO | Source: Ambulatory Visit | Attending: Orthopedic Surgery | Admitting: Orthopedic Surgery

## 2014-08-18 ENCOUNTER — Encounter (HOSPITAL_COMMUNITY): Payer: Self-pay

## 2014-08-18 DIAGNOSIS — M179 Osteoarthritis of knee, unspecified: Secondary | ICD-10-CM | POA: Insufficient documentation

## 2014-08-18 DIAGNOSIS — Z01818 Encounter for other preprocedural examination: Secondary | ICD-10-CM | POA: Insufficient documentation

## 2014-08-18 DIAGNOSIS — Z01812 Encounter for preprocedural laboratory examination: Secondary | ICD-10-CM | POA: Insufficient documentation

## 2014-08-18 HISTORY — DX: Cardiac arrhythmia, unspecified: I49.9

## 2014-08-18 HISTORY — DX: Essential (primary) hypertension: I10

## 2014-08-18 HISTORY — DX: Unspecified hearing loss, unspecified ear: H91.90

## 2014-08-18 HISTORY — DX: Unspecified osteoarthritis, unspecified site: M19.90

## 2014-08-18 LAB — CBC WITH DIFFERENTIAL/PLATELET
BASOS ABS: 0.1 10*3/uL (ref 0.0–0.1)
Basophils Relative: 1 % (ref 0–1)
Eosinophils Absolute: 0.2 10*3/uL (ref 0.0–0.7)
Eosinophils Relative: 4 % (ref 0–5)
HCT: 40.3 % (ref 39.0–52.0)
HEMOGLOBIN: 14.1 g/dL (ref 13.0–17.0)
LYMPHS ABS: 2.1 10*3/uL (ref 0.7–4.0)
LYMPHS PCT: 33 % (ref 12–46)
MCH: 31.7 pg (ref 26.0–34.0)
MCHC: 35 g/dL (ref 30.0–36.0)
MCV: 90.6 fL (ref 78.0–100.0)
MONO ABS: 0.6 10*3/uL (ref 0.1–1.0)
Monocytes Relative: 9 % (ref 3–12)
Neutro Abs: 3.4 10*3/uL (ref 1.7–7.7)
Neutrophils Relative %: 53 % (ref 43–77)
Platelets: 221 10*3/uL (ref 150–400)
RBC: 4.45 MIL/uL (ref 4.22–5.81)
RDW: 12.4 % (ref 11.5–15.5)
WBC: 6.4 10*3/uL (ref 4.0–10.5)

## 2014-08-18 LAB — COMPREHENSIVE METABOLIC PANEL
ALBUMIN: 4.6 g/dL (ref 3.5–5.2)
ALK PHOS: 55 U/L (ref 39–117)
ALT: 17 U/L (ref 0–53)
AST: 20 U/L (ref 0–37)
Anion gap: 7 (ref 5–15)
BUN: 14 mg/dL (ref 6–23)
CHLORIDE: 103 mmol/L (ref 96–112)
CO2: 25 mmol/L (ref 19–32)
Calcium: 9.1 mg/dL (ref 8.4–10.5)
Creatinine, Ser: 0.74 mg/dL (ref 0.50–1.35)
GFR calc Af Amer: >90 mL/min (ref >90)
GFR calc non Af Amer: >90 mL/min (ref >90)
Glucose, Bld: 100 mg/dL — ABNORMAL HIGH (ref 70–99)
POTASSIUM: 4.4 mmol/L (ref 3.5–5.1)
SODIUM: 135 mmol/L (ref 135–145)
TOTAL PROTEIN: 7.2 g/dL (ref 6.0–8.3)
Total Bilirubin: 0.8 mg/dL (ref 0.3–1.2)

## 2014-08-18 LAB — URINALYSIS, ROUTINE W REFLEX MICROSCOPIC
BILIRUBIN URINE: NEGATIVE
Glucose, UA: NEGATIVE mg/dL
Hgb urine dipstick: NEGATIVE
KETONES UR: NEGATIVE mg/dL
Leukocytes, UA: NEGATIVE
NITRITE: NEGATIVE
Protein, ur: NEGATIVE mg/dL
SPECIFIC GRAVITY, URINE: 1.007 (ref 1.005–1.030)
Urobilinogen, UA: 0.2 mg/dL (ref 0.0–1.0)
pH: 6 (ref 5.0–8.0)

## 2014-08-18 LAB — APTT: APTT: 27 s (ref 24–37)

## 2014-08-18 LAB — SURGICAL PCR SCREEN
MRSA, PCR: NEGATIVE
Staphylococcus aureus: NEGATIVE

## 2014-08-18 LAB — PROTIME-INR
INR: 1.02 (ref 0.00–1.49)
PROTHROMBIN TIME: 13.5 s (ref 11.6–15.2)

## 2014-08-18 LAB — ABO/RH: ABO/RH(D): A POS

## 2014-08-18 LAB — TYPE AND SCREEN
ABO/RH(D): A POS
Antibody Screen: NEGATIVE

## 2014-08-18 NOTE — Progress Notes (Signed)
PCP is Jill Alexanders and Cardiologist is Glenetta Hew. Patient denied having any acute cardiac or pulmonary issues. Patient had a office visit with Dr. Ellyn Hack on 07/06/14 and Dr. Ellyn Hack ordered for patient to take Diltiazem 5 days before and 5 days after surgery, however it was unclear if patient could stop aspirin before surgery. Nurse called office and spoke with "Billie" and stated that clarification was needed to see if patient needed to stop aspirin before surgery. Billie stated Dr. Ellyn Hack and his Nurse were not in the office today but a message would be sent. Direct call back number left. Patient informed of this.

## 2014-08-18 NOTE — Progress Notes (Signed)
Nurse called patient and left a voicemail stating to stop aspirin 4-5 days prior to surgery. Note was seen in EPIC under encoutners tab on 07/13/14.

## 2014-08-18 NOTE — Progress Notes (Signed)
Anesthesia Chart Review:  Pt is 64 year old male scheduled for R unicompartmental knee on 09/01/2014 with Dr. Noemi Chapel.   PCP is Dr. Redmond School. Cardiologist is Dr. Ellyn Hack.   PMH includes: HTN, PAF, hard of hearing. Former smoker. BMI 29.   Medications include: ASA. Pt to stop 4-5 days prior to surgery per Epic telephone encounter by Trixie Dredge on 07/13/2014.   Preoperative labs reviewed.    Chest x-ray reviewed. No active cardiopulmonary disease.   EKG 07/06/2014:  Sinus rhythm.   Echo 11/27/2010:  -LVEF >55% -Normal wall thickness -Normal LA size -Stage 1 diastolic dysfunction -Normal LV filling pressure -Trace MR - mild anterior mitral leaflet thickening -Trace TR -Normal RVSP  Stress test 11/27/2010: -There is no scintigraphic evidence of inducible myocardial ischemia.  -The post-stress EF is 63%. No significant wall motion abnormalities noted.  -Exercise capacity 12 METS. EKG shows NSR at 61. No exercise induced ischemic EKG changes. THR achieved. Good exercise effort.  -Normal myocardial perfusion study.    Pt has clearance from Dr. Ellyn Hack for surgery in Epic note dated 07/07/2014. "In order to prophylactically compare for possible perioperative A. Fib, I recommended that he starts taking diltiazem for a week before and week after the surgery. Take Diltiazem 5 days before surgery and 5 days after Surgery, then take as needed."  If no changes, I anticipate pt can proceed with surgery as scheduled.   Matthew Cass, FNP-BC [facility]/Anesthesiology Phone: [phone] 08/18/2014 4:58 PM

## 2014-08-18 NOTE — Telephone Encounter (Signed)
Cammie Sickle is calling to see if  Matthew Mason can stop his aspirin before his procedure on 09/01/2014.Marland Kitchen Please call   Thanks

## 2014-08-18 NOTE — Pre-Procedure Instructions (Signed)
LES LONGMORE  08/18/2014   Your procedure is scheduled on:  Friday September 01, 2014 at 9:45 AM.  Report to [facility] Admitting at 7:45 AM.  Call this number if you have problems the morning of surgery: [phone]  For any other questions Monday-Friday from 8am-4pm call: [phone]    Remember:   Do not eat food or drink liquids after midnight.   Take these medicines the morning of surgery with A SIP OF WATER: Diltiazem (Dilacor)   Please stop taking any aspirin, Ibuprofen, herbal medications, etc on Friday March 4th   Do not wear jewelry.  Do not wear lotions, powders, or cologne.   Men may shave face and neck.  Do not bring valuables to the hospital.  [facility] is not responsible for any belongings or valuables.               Contacts, dentures or bridgework may not be worn into surgery.  Leave suitcase in the car. After surgery it may be brought to your room.  For patients admitted to the hospital, discharge time is determined by your treatment team.               Patients discharged the day of surgery will not be allowed to drive home.  Name and phone number of your driver:   Special Instructions: Shower using CHG soap the night before and the morning of your surgery   Please read over the following fact sheets that you were given: Pain Booklet, Coughing and Deep Breathing, Blood Transfusion Information, MRSA Information and Surgical Site Infection Prevention

## 2014-08-19 LAB — URINE CULTURE
COLONY COUNT: NO GROWTH
CULTURE: NO GROWTH

## 2014-08-21 NOTE — Progress Notes (Signed)
Nurse called patient to ensure he received message about stopping aspirin 4-5 days before surgery. No answer. Another voicemail and direct call back number left.

## 2014-08-21 NOTE — Telephone Encounter (Signed)
Yes - OK to hold ASA    DH 

## 2014-08-21 NOTE — Telephone Encounter (Signed)
Spoke to Belt THE INFORMATION CONCERNING PATIENT  IT IS OKAY TO STOP ASPIRIN FOR SURGERY.

## 2014-08-31 MED ORDER — CEFAZOLIN SODIUM-DEXTROSE 2-3 GM-% IV SOLR
2.0000 g | INTRAVENOUS | Status: AC
  Administered 2014-09-01: 2 g via INTRAVENOUS
  Filled 2014-08-31: qty 50

## 2014-08-31 NOTE — Anesthesia Preprocedure Evaluation (Addendum)
Anesthesia Evaluation  Patient identified by MRN, date of birth, ID band Patient awake    Reviewed: Allergy & Precautions, NPO status , Patient's Chart, lab work & pertinent test results  Airway Mallampati: II   Neck ROM: Full    Dental  (+) Dental Advisory Given   Pulmonary former smoker,  breath sounds clear to auscultation        Cardiovascular hypertension, Pt. on medications  Normal Stress and Normal ECHO 2012, Has Hx of AF sinus now, has been cleared by cardiology   Neuro/Psych    GI/Hepatic   Endo/Other    Renal/GU      Musculoskeletal   Abdominal (+)  Abdomen: soft.    Peds  Hematology 14/40   Anesthesia Other Findings   Reproductive/Obstetrics                           Anesthesia Physical Anesthesia Plan  ASA: II  Anesthesia Plan: General   Post-op Pain Management: MAC Combined w/ Regional for Post-op pain   Induction: Intravenous  Airway Management Planned: Oral ETT  Additional Equipment:   Intra-op Plan:   Post-operative Plan: Extubation in OR  Informed Consent: I have reviewed the patients History and Physical, chart, labs and discussed the procedure including the risks, benefits and alternatives for the proposed anesthesia with the patient or authorized representative who has indicated his/her understanding and acceptance.     Plan Discussed with:   Anesthesia Plan Comments:         Anesthesia Quick Evaluation

## 2014-08-31 NOTE — Progress Notes (Signed)
Pt notified of time change;to arrive at 0530-verbalized understanding

## 2014-08-31 NOTE — H&P (Signed)
TOTAL KNEE ADMISSION H&P  Patient is being admitted for right unicompartmental total knee arthroplasty.  Subjective:  Chief Complaint:right knee pain.  HPI: Matthew Mason, 64 y.o. male, has a history of pain and functional disability in the right knee due to arthritis and has failed non-surgical conservative treatments for greater than 12 weeks to includeNSAID's and/or analgesics, corticosteriod injections and activity modification.  Onset of symptoms was gradual, starting 10 years ago with gradually worsening course since that time. The patient noted prior procedures on the knee to include  arthroscopy and menisectomy on the right knee(s).  Patient currently rates pain in the right knee(s) at 8 out of 10 with activity. Patient has night pain, worsening of pain with activity and weight bearing and pain that interferes with activities of daily living.  Patient has evidence of joint space narrowing by imaging studies. This patient has had mri showing medial compartment primary localized arthritis with only minimal changes laterally and patellofemoral. There is no active infection.  Patient Active Problem List   Diagnosis Date Noted  . Preoperative cardiovascular examination 07/07/2014  . Atrial fibrillation, unspecified 01/26/2014  . Personal history of colonic polyps 01/26/2014  . Bilateral hearing loss 01/26/2014   Past Medical History  Diagnosis Date  . Personal history of colonic polyps   . Hemorrhoids   . Paroxysmal atrial fibrillation 2012    Several short episodes on Event Monitor aftrer initial hospitalization. -- On PRN Diltiazem & ASA  . Dysrhythmia   . Hypertension     Dr. Ellyn Hack took patient off BP meds  . Arthritis     knee  . HOH (hard of hearing)     Bilateral hearing aids    Past Surgical History  Procedure Laterality Date  . Colonoscopy  01/07    Dr. Olevia Perches  . Nm myoview ltd  June 2012    Treadmill: Walks 11.5 minutes, 12 METs, no ischemia or infarction  .  Transthoracic echocardiogram  June 2012     Normal LV size and function, EF>55%,Gr 1 DD, no regional WMA, Trace MR.  . Knee arthroscopy Right     No prescriptions prior to admission   No Known Allergies  History  Substance Use Topics  . Smoking status: Former Smoker    Quit date: 06/23/1998  . Smokeless tobacco: Never Used  . Alcohol Use: 15.0 oz/week    25 Cans of beer per week     Comment: 3-4 times a week    Family History  Problem Relation Age of Onset  . Heart disease Mother     Arrhythmia with ablation  . Heart disease Father 97    MI  . Cancer Brother 68    Prostate cancer     ROS  Objective:  Physical Exam  Constitutional: He is oriented to person, place, and time. He appears well-developed and well-nourished.  HENT:  Head: Normocephalic and atraumatic.  Eyes: EOM are normal. Pupils are equal, round, and reactive to light.  Neck: Normal range of motion. Neck supple.  Cardiovascular: Normal rate and regular rhythm.   Respiratory: Effort normal and breath sounds normal.  GI: Soft. Bowel sounds are normal.  Musculoskeletal: Normal range of motion.       Right knee: He exhibits swelling. Tenderness found.  Neurological: He is alert and oriented to person, place, and time.  Skin: Skin is warm and dry.  Psychiatric: He has a normal mood and affect.    Vital signs in last 24 hours: @VSRANGES @  Labs:   Estimated body mass index is 28.29 kg/(m^2) as calculated from the following:   Height as of 07/06/14: 5\' 8"  (1.727 m).   Weight as of 07/06/14: 84.369 kg (186 lb).   Imaging Review Plain radiographs demonstrate moderate degenerative joint disease of the right knee(s). The overall alignment isneutral. The bone quality appears to be adequate for age and reported activity level.  Assessment/Plan:  End stage arthritis, right knee medial compartment  The patient history, physical examination, clinical judgment of the provider and imaging studies are consistent  with end stage degenerative joint disease of the right knee(s) and total knee arthroplasty is deemed medically necessary. The treatment options including medical management, injection therapy arthroscopy and arthroplasty were discussed at length. The risks and benefits of total knee arthroplasty were presented and reviewed. The risks due to aseptic loosening, infection, stiffness, patella tracking problems, thromboembolic complications and other imponderables were discussed. The patient acknowledged the explanation, agreed to proceed with the plan and consent was signed. Patient is being admitted for inpatient treatment for surgery, pain control, PT, OT, prophylactic antibiotics, VTE prophylaxis, progressive ambulation and ADL's and discharge planning. The patient is planning to be discharged home with home health services

## 2014-09-01 ENCOUNTER — Inpatient Hospital Stay (HOSPITAL_COMMUNITY): Payer: Federal, State, Local not specified - PPO | Admitting: Certified Registered Nurse Anesthetist

## 2014-09-01 ENCOUNTER — Encounter (HOSPITAL_COMMUNITY)
Admission: RE | Disposition: A | Discharge status: Home / Self Care | Payer: Self-pay | Source: Ambulatory Visit | Attending: Orthopedic Surgery

## 2014-09-01 ENCOUNTER — Encounter (HOSPITAL_COMMUNITY): Payer: Self-pay | Admitting: *Deleted

## 2014-09-01 ENCOUNTER — Inpatient Hospital Stay (HOSPITAL_COMMUNITY): Payer: Federal, State, Local not specified - PPO | Admitting: Emergency Medicine

## 2014-09-01 ENCOUNTER — Ambulatory Visit (HOSPITAL_COMMUNITY)
Admission: RE | Admit: 2014-09-01 | Discharge: 2014-09-02 | Disposition: A | Discharge status: Home / Self Care | Payer: Federal, State, Local not specified - PPO | Source: Ambulatory Visit | Attending: Orthopedic Surgery | Admitting: Orthopedic Surgery

## 2014-09-01 DIAGNOSIS — I480 Paroxysmal atrial fibrillation: Secondary | ICD-10-CM | POA: Diagnosis present

## 2014-09-01 DIAGNOSIS — H9193 Unspecified hearing loss, bilateral: Secondary | ICD-10-CM | POA: Diagnosis present

## 2014-09-01 DIAGNOSIS — Z87891 Personal history of nicotine dependence: Secondary | ICD-10-CM | POA: Diagnosis not present

## 2014-09-01 DIAGNOSIS — M1711 Unilateral primary osteoarthritis, right knee: Secondary | ICD-10-CM | POA: Diagnosis present

## 2014-09-01 DIAGNOSIS — M67362 Transient synovitis, left knee: Secondary | ICD-10-CM

## 2014-09-01 DIAGNOSIS — I10 Essential (primary) hypertension: Secondary | ICD-10-CM | POA: Insufficient documentation

## 2014-09-01 DIAGNOSIS — M659 Synovitis and tenosynovitis, unspecified: Secondary | ICD-10-CM | POA: Diagnosis not present

## 2014-09-01 HISTORY — PX: PARTIAL KNEE ARTHROPLASTY: SHX2174

## 2014-09-01 SURGERY — ARTHROPLASTY, KNEE, UNICOMPARTMENTAL
Anesthesia: Monitor Anesthesia Care | Site: Knee | Laterality: Right

## 2014-09-01 MED ORDER — MEPERIDINE HCL 25 MG/ML IJ SOLN: 6.2500 mg | INTRAMUSCULAR | Status: DC | PRN

## 2014-09-01 MED ORDER — CHLORHEXIDINE GLUCONATE 4 % EX LIQD: 60.0000 mL | Freq: Once | CUTANEOUS | Status: DC

## 2014-09-01 MED ORDER — POLYETHYLENE GLYCOL 3350 17 G PO PACK
17.0000 g | PACK | Freq: Two times a day (BID) | ORAL | Status: DC
  Administered 2014-09-01 – 2014-09-02 (×2): 17 g via ORAL
  Filled 2014-09-01 (×2): qty 1

## 2014-09-01 MED ORDER — 0.9 % SODIUM CHLORIDE (POUR BTL) OPTIME
TOPICAL | Status: DC | PRN
  Administered 2014-09-01: 1000 mL

## 2014-09-01 MED ORDER — PHENYLEPHRINE HCL 10 MG/ML IJ SOLN
INTRAMUSCULAR | Status: DC | PRN
  Administered 2014-09-01: 160 ug via INTRAVENOUS
  Administered 2014-09-01 (×5): 80 ug via INTRAVENOUS

## 2014-09-01 MED ORDER — PHENYLEPHRINE 40 MCG/ML (10ML) SYRINGE FOR IV PUSH (FOR BLOOD PRESSURE SUPPORT)
PREFILLED_SYRINGE | INTRAVENOUS | Status: AC
  Filled 2014-09-01: qty 10

## 2014-09-01 MED ORDER — DEXAMETHASONE SODIUM PHOSPHATE 10 MG/ML IJ SOLN
INTRAMUSCULAR | Status: DC | PRN
  Administered 2014-09-01: 10 mg via INTRAVENOUS

## 2014-09-01 MED ORDER — ACETAMINOPHEN 325 MG PO TABS
650.0000 mg | ORAL_TABLET | Freq: Four times a day (QID) | ORAL | Status: DC | PRN
  Administered 2014-09-01: 650 mg via ORAL
  Filled 2014-09-01: qty 2

## 2014-09-01 MED ORDER — PROPOFOL 10 MG/ML IV BOLUS
INTRAVENOUS | Status: AC
  Filled 2014-09-01: qty 20

## 2014-09-01 MED ORDER — ALUM & MAG HYDROXIDE-SIMETH 200-200-20 MG/5ML PO SUSP: 30.0000 mL | ORAL | Status: DC | PRN

## 2014-09-01 MED ORDER — BUPIVACAINE-EPINEPHRINE (PF) 0.25% -1:200000 IJ SOLN
INTRAMUSCULAR | Status: AC
  Filled 2014-09-01: qty 30

## 2014-09-01 MED ORDER — ROCURONIUM BROMIDE 50 MG/5ML IV SOLN
INTRAVENOUS | Status: AC
  Filled 2014-09-01: qty 1

## 2014-09-01 MED ORDER — MENTHOL 3 MG MT LOZG: 1.0000 | LOZENGE | OROMUCOSAL | Status: DC | PRN

## 2014-09-01 MED ORDER — POTASSIUM CHLORIDE IN NACL 20-0.9 MEQ/L-% IV SOLN
INTRAVENOUS | Status: DC
  Administered 2014-09-01 – 2014-09-02 (×2): via INTRAVENOUS
  Filled 2014-09-01 (×4): qty 1000

## 2014-09-01 MED ORDER — METOCLOPRAMIDE HCL 5 MG PO TABS
5.0000 mg | ORAL_TABLET | Freq: Three times a day (TID) | ORAL | Status: DC | PRN
  Filled 2014-09-01: qty 2

## 2014-09-01 MED ORDER — BUPIVACAINE-EPINEPHRINE (PF) 0.5% -1:200000 IJ SOLN
INTRAMUSCULAR | Status: DC | PRN
  Administered 2014-09-01: 30 mL via PERINEURAL

## 2014-09-01 MED ORDER — BUPIVACAINE HCL (PF) 0.25 % IJ SOLN
INTRAMUSCULAR | Status: AC
  Filled 2014-09-01: qty 30

## 2014-09-01 MED ORDER — DOCUSATE SODIUM 100 MG PO CAPS
100.0000 mg | ORAL_CAPSULE | Freq: Two times a day (BID) | ORAL | Status: DC
  Administered 2014-09-01 – 2014-09-02 (×2): 100 mg via ORAL
  Filled 2014-09-01 (×2): qty 1

## 2014-09-01 MED ORDER — CEFAZOLIN SODIUM-DEXTROSE 2-3 GM-% IV SOLR
2.0000 g | Freq: Four times a day (QID) | INTRAVENOUS | Status: AC
  Administered 2014-09-01 (×2): 2 g via INTRAVENOUS
  Filled 2014-09-01 (×2): qty 50

## 2014-09-01 MED ORDER — MIDAZOLAM HCL 2 MG/2ML IJ SOLN
INTRAMUSCULAR | Status: AC
  Filled 2014-09-01: qty 2

## 2014-09-01 MED ORDER — EPHEDRINE SULFATE 50 MG/ML IJ SOLN
INTRAMUSCULAR | Status: DC | PRN
  Administered 2014-09-01 (×2): 15 mg via INTRAVENOUS

## 2014-09-01 MED ORDER — ONDANSETRON HCL 4 MG/2ML IJ SOLN: 4.0000 mg | Freq: Four times a day (QID) | INTRAMUSCULAR | Status: DC | PRN

## 2014-09-01 MED ORDER — FENTANYL CITRATE 0.05 MG/ML IJ SOLN
INTRAMUSCULAR | Status: AC
  Filled 2014-09-01: qty 5

## 2014-09-01 MED ORDER — ONDANSETRON HCL 4 MG PO TABS: 4.0000 mg | ORAL_TABLET | Freq: Four times a day (QID) | ORAL | Status: DC | PRN

## 2014-09-01 MED ORDER — PHENOL 1.4 % MT LIQD
1.0000 | OROMUCOSAL | Status: DC | PRN
Start: 2014-09-01 — End: 2014-09-02

## 2014-09-01 MED ORDER — DEXAMETHASONE SODIUM PHOSPHATE 10 MG/ML IJ SOLN
10.0000 mg | Freq: Three times a day (TID) | INTRAMUSCULAR | Status: DC
  Administered 2014-09-02 (×2): 10 mg via INTRAVENOUS
  Filled 2014-09-01 (×2): qty 1

## 2014-09-01 MED ORDER — LACTATED RINGERS IV SOLN
INTRAVENOUS | Status: DC | PRN
  Administered 2014-09-01 (×2): via INTRAVENOUS

## 2014-09-01 MED ORDER — OXYCODONE HCL 5 MG PO TABS
5.0000 mg | ORAL_TABLET | ORAL | Status: DC | PRN
  Administered 2014-09-01 – 2014-09-02 (×5): 10 mg via ORAL
  Filled 2014-09-01 (×5): qty 2

## 2014-09-01 MED ORDER — LIDOCAINE HCL (CARDIAC) 20 MG/ML IV SOLN
INTRAVENOUS | Status: AC
  Filled 2014-09-01: qty 5

## 2014-09-01 MED ORDER — LACTATED RINGERS IV SOLN: INTRAVENOUS | Status: DC

## 2014-09-01 MED ORDER — SODIUM CHLORIDE 0.9 % IJ SOLN
INTRAMUSCULAR | Status: AC
Start: 2014-09-01 — End: 2014-09-01
  Filled 2014-09-01: qty 10

## 2014-09-01 MED ORDER — HYDROMORPHONE HCL 1 MG/ML IJ SOLN
1.0000 mg | INTRAMUSCULAR | Status: DC | PRN
Start: 2014-09-01 — End: 2014-09-02
  Administered 2014-09-02: 1 mg via INTRAVENOUS
  Filled 2014-09-01: qty 1

## 2014-09-01 MED ORDER — METHYLPREDNISOLONE ACETATE 80 MG/ML IJ SUSP
INTRAMUSCULAR | Status: AC
  Filled 2014-09-01: qty 1

## 2014-09-01 MED ORDER — FENTANYL CITRATE 0.05 MG/ML IJ SOLN: 25.0000 ug | INTRAMUSCULAR | Status: DC | PRN

## 2014-09-01 MED ORDER — CELECOXIB 200 MG PO CAPS
200.0000 mg | ORAL_CAPSULE | Freq: Two times a day (BID) | ORAL | Status: DC
  Administered 2014-09-01 – 2014-09-02 (×2): 200 mg via ORAL
  Filled 2014-09-01 (×2): qty 1

## 2014-09-01 MED ORDER — MIDAZOLAM HCL 2 MG/2ML IJ SOLN
INTRAMUSCULAR | Status: AC
  Administered 2014-09-01: 2 mg via INTRAVENOUS
  Filled 2014-09-01: qty 2

## 2014-09-01 MED ORDER — SUCCINYLCHOLINE CHLORIDE 20 MG/ML IJ SOLN
INTRAMUSCULAR | Status: AC
  Filled 2014-09-01: qty 1

## 2014-09-01 MED ORDER — CEFAZOLIN SODIUM-DEXTROSE 2-3 GM-% IV SOLR: 2.0000 g | INTRAVENOUS | Status: DC

## 2014-09-01 MED ORDER — ONDANSETRON HCL 4 MG/2ML IJ SOLN
INTRAMUSCULAR | Status: AC
  Filled 2014-09-01: qty 2

## 2014-09-01 MED ORDER — ASPIRIN EC 325 MG PO TBEC: DELAYED_RELEASE_TABLET | ORAL | Status: DC

## 2014-09-01 MED ORDER — FENTANYL CITRATE 0.05 MG/ML IJ SOLN
INTRAMUSCULAR | Status: AC
  Administered 2014-09-01 (×2): 50 ug via INTRAVENOUS
  Filled 2014-09-01: qty 2

## 2014-09-01 MED ORDER — CHLORHEXIDINE GLUCONATE 4 % EX LIQD
60.0000 mL | Freq: Once | CUTANEOUS | Status: DC
  Filled 2014-09-01: qty 60

## 2014-09-01 MED ORDER — PROPOFOL 10 MG/ML IV BOLUS
INTRAVENOUS | Status: AC
Start: 2014-09-01 — End: 2014-09-01
  Filled 2014-09-01: qty 20

## 2014-09-01 MED ORDER — BUPIVACAINE HCL (PF) 0.25 % IJ SOLN
INTRAMUSCULAR | Status: DC | PRN
  Administered 2014-09-01: 3 mL via INTRA_ARTICULAR

## 2014-09-01 MED ORDER — ACETAMINOPHEN 650 MG RE SUPP: 650.0000 mg | Freq: Four times a day (QID) | RECTAL | Status: DC | PRN

## 2014-09-01 MED ORDER — ASPIRIN EC 325 MG PO TBEC
325.0000 mg | DELAYED_RELEASE_TABLET | Freq: Every day | ORAL | Status: DC
  Administered 2014-09-02: 325 mg via ORAL
  Filled 2014-09-01: qty 1

## 2014-09-01 MED ORDER — EPHEDRINE SULFATE 50 MG/ML IJ SOLN
INTRAMUSCULAR | Status: AC
  Filled 2014-09-01: qty 1

## 2014-09-01 MED ORDER — METOCLOPRAMIDE HCL 5 MG/ML IJ SOLN: 5.0000 mg | Freq: Three times a day (TID) | INTRAMUSCULAR | Status: DC | PRN

## 2014-09-01 MED ORDER — PROMETHAZINE HCL 25 MG/ML IJ SOLN: 6.2500 mg | INTRAMUSCULAR | Status: DC | PRN

## 2014-09-01 MED ORDER — OXYCODONE HCL 5 MG PO TABS: ORAL_TABLET | ORAL | Status: DC

## 2014-09-01 MED ORDER — PROPOFOL INFUSION 10 MG/ML OPTIME
INTRAVENOUS | Status: DC | PRN
  Administered 2014-09-01: 100 ug/kg/min via INTRAVENOUS

## 2014-09-01 MED ORDER — BUPIVACAINE-EPINEPHRINE 0.25% -1:200000 IJ SOLN
INTRAMUSCULAR | Status: DC | PRN
  Administered 2014-09-01: 30 mL

## 2014-09-01 MED ORDER — METHYLPREDNISOLONE ACETATE 80 MG/ML IJ SUSP
INTRAMUSCULAR | Status: DC | PRN
  Administered 2014-09-01: 80 mg via INTRA_ARTICULAR

## 2014-09-01 MED ORDER — SODIUM CHLORIDE 0.9 % IR SOLN
Status: DC | PRN
  Administered 2014-09-01: 3000 mL

## 2014-09-01 MED ORDER — DIPHENHYDRAMINE HCL 12.5 MG/5ML PO ELIX: 12.5000 mg | ORAL_SOLUTION | ORAL | Status: DC | PRN

## 2014-09-01 MED ORDER — DILTIAZEM HCL ER 120 MG PO CP24
120.0000 mg | ORAL_CAPSULE | Freq: Every day | ORAL | Status: DC
  Administered 2014-09-02: 120 mg via ORAL
  Filled 2014-09-01 (×2): qty 1

## 2014-09-01 SURGICAL SUPPLY — 75 items
BANDAGE ELASTIC 4 VELCRO ST LF (GAUZE/BANDAGES/DRESSINGS) IMPLANT
BANDAGE ELASTIC 6 VELCRO ST LF (GAUZE/BANDAGES/DRESSINGS) ×3 IMPLANT
BANDAGE ESMARK 6X9 LF (GAUZE/BANDAGES/DRESSINGS) ×1 IMPLANT
BENZOIN TINCTURE PRP APPL 2/3 (GAUZE/BANDAGES/DRESSINGS) ×3 IMPLANT
BIT DRILL QUICK REL 1/8 2PK SL (DRILL) ×1 IMPLANT
BLADE SURG 11 STRL SS (BLADE) ×3 IMPLANT
BNDG ESMARK 6X9 LF (GAUZE/BANDAGES/DRESSINGS) ×3
BOWL SMART MIX CTS (DISPOSABLE) ×3 IMPLANT
CAPT KNEE PARTIAL 2 ×3 IMPLANT
CEMENT HV SMART SET (Cement) ×3 IMPLANT
CLOSURE WOUND 1/2 X4 (GAUZE/BANDAGES/DRESSINGS) ×1
COVER BACK TABLE 24X17X13 BIG (DRAPES) IMPLANT
COVER SURGICAL LIGHT HANDLE (MISCELLANEOUS) ×3 IMPLANT
CUFF TOURNIQUET SINGLE 34IN LL (TOURNIQUET CUFF) ×3 IMPLANT
CUFF TOURNIQUET SINGLE 44IN (TOURNIQUET CUFF) IMPLANT
DRAPE EXTREMITY T 121X128X90 (DRAPE) ×3 IMPLANT
DRAPE IMP U-DRAPE 54X76 (DRAPES) ×3 IMPLANT
DRAPE INCISE IOBAN 66X45 STRL (DRAPES) ×3 IMPLANT
DRAPE PROXIMA HALF (DRAPES) ×3 IMPLANT
DRAPE U-SHAPE 47X51 STRL (DRAPES) ×3 IMPLANT
DRILL QUICK RELEASE 1/8 INCH (DRILL) ×2
DRSG ADAPTIC 3X8 NADH LF (GAUZE/BANDAGES/DRESSINGS) ×3 IMPLANT
DRSG AQUACEL AG ADV 3.5X10 (GAUZE/BANDAGES/DRESSINGS) ×3 IMPLANT
DRSG PAD ABDOMINAL 8X10 ST (GAUZE/BANDAGES/DRESSINGS) ×3 IMPLANT
DURAPREP 26ML APPLICATOR (WOUND CARE) ×6 IMPLANT
ELECT CAUTERY BLADE 6.4 (BLADE) ×3 IMPLANT
ELECT REM PT RETURN 9FT ADLT (ELECTROSURGICAL) ×3
ELECTRODE REM PT RTRN 9FT ADLT (ELECTROSURGICAL) ×1 IMPLANT
EVACUATOR 1/8 PVC DRAIN (DRAIN) IMPLANT
FACESHIELD WRAPAROUND (MASK) ×3 IMPLANT
GAUZE SPONGE 4X4 12PLY STRL (GAUZE/BANDAGES/DRESSINGS) ×3 IMPLANT
GLOVE BIO SURGEON STRL SZ7 (GLOVE) ×3 IMPLANT
GLOVE BIOGEL PI IND STRL 7.0 (GLOVE) ×1 IMPLANT
GLOVE BIOGEL PI IND STRL 7.5 (GLOVE) ×1 IMPLANT
GLOVE BIOGEL PI IND STRL 8 (GLOVE) ×1 IMPLANT
GLOVE BIOGEL PI INDICATOR 7.0 (GLOVE) ×2
GLOVE BIOGEL PI INDICATOR 7.5 (GLOVE) ×2
GLOVE BIOGEL PI INDICATOR 8 (GLOVE) ×2
GLOVE BIOGEL PI ORTHO PRO 7.5 (GLOVE) ×2
GLOVE PI ORTHO PRO STRL 7.5 (GLOVE) ×1 IMPLANT
GLOVE SS BIOGEL STRL SZ 7.5 (GLOVE) ×1 IMPLANT
GLOVE SUPERSENSE BIOGEL SZ 7.5 (GLOVE) ×2
GOWN STRL REUS W/ TWL LRG LVL3 (GOWN DISPOSABLE) ×2 IMPLANT
GOWN STRL REUS W/ TWL XL LVL3 (GOWN DISPOSABLE) ×3 IMPLANT
GOWN STRL REUS W/TWL LRG LVL3 (GOWN DISPOSABLE) ×4
GOWN STRL REUS W/TWL XL LVL3 (GOWN DISPOSABLE) ×6
HANDPIECE INTERPULSE COAX TIP (DISPOSABLE) ×2
HOOD PEEL AWAY FACE SHEILD DIS (HOOD) ×9 IMPLANT
IMMOBILIZER KNEE 22 UNIV (SOFTGOODS) IMPLANT
KIT BASIN OR (CUSTOM PROCEDURE TRAY) ×3 IMPLANT
KIT ROOM TURNOVER OR (KITS) ×3 IMPLANT
MANIFOLD NEPTUNE II (INSTRUMENTS) ×3 IMPLANT
NEEDLE SPNL 18GX3.5 QUINCKE PK (NEEDLE) ×3 IMPLANT
NS IRRIG 1000ML POUR BTL (IV SOLUTION) ×3 IMPLANT
PACK BLADE SAW RECIP 70 3 PT (BLADE) ×3 IMPLANT
PACK TOTAL JOINT (CUSTOM PROCEDURE TRAY) ×3 IMPLANT
PACK UNIVERSAL I (CUSTOM PROCEDURE TRAY) ×3 IMPLANT
PAD ARMBOARD 7.5X6 YLW CONV (MISCELLANEOUS) ×6 IMPLANT
PAD CAST 4YDX4 CTTN HI CHSV (CAST SUPPLIES) IMPLANT
PADDING CAST COTTON 4X4 STRL (CAST SUPPLIES)
PADDING CAST COTTON 6X4 STRL (CAST SUPPLIES) IMPLANT
RUBBERBAND STERILE (MISCELLANEOUS) ×3 IMPLANT
SET HNDPC FAN SPRY TIP SCT (DISPOSABLE) ×1 IMPLANT
STRIP CLOSURE SKIN 1/2X4 (GAUZE/BANDAGES/DRESSINGS) ×2 IMPLANT
SUCTION FRAZIER TIP 10 FR DISP (SUCTIONS) ×3 IMPLANT
SUT ETHIBOND NAB CT1 #1 30IN (SUTURE) ×6 IMPLANT
SUT MNCRL AB 3-0 PS2 18 (SUTURE) ×3 IMPLANT
SUT VIC AB 0 CT1 27 (SUTURE) ×4
SUT VIC AB 0 CT1 27XBRD ANBCTR (SUTURE) ×2 IMPLANT
SUT VIC AB 2-0 CT1 27 (SUTURE) ×4
SUT VIC AB 2-0 CT1 TAPERPNT 27 (SUTURE) ×2 IMPLANT
TOWEL OR 17X24 6PK STRL BLUE (TOWEL DISPOSABLE) ×3 IMPLANT
TOWEL OR 17X26 10 PK STRL BLUE (TOWEL DISPOSABLE) ×3 IMPLANT
TRAY FOLEY CATH 16FRSI W/METER (SET/KITS/TRAYS/PACK) IMPLANT
WATER STERILE IRR 1000ML POUR (IV SOLUTION) ×9 IMPLANT

## 2014-09-01 NOTE — Progress Notes (Signed)
Orthopedic Tech Progress Note Patient Details:  Matthew Mason [birth year] [id number] cpm was only applied to the right knee; viewed order from doctor's order list CPM Left Knee CPM Left Knee: On Left Knee Flexion (Degrees): 90 Left Knee Extension (Degrees): 0 Additional Comments: trapeze bar patient helper CPM Right Knee CPM Right Knee: On Right Knee Flexion (Degrees): 90 Right Knee Extension (Degrees): 0 Additional Comments:  (Pt tolerating well)   [name] 09/01/2014, 10:14 AM

## 2014-09-01 NOTE — Transfer of Care (Signed)
Immediate Anesthesia Transfer of Care Note  Patient: Matthew Mason  Procedure(s) Performed: Procedure(s): RIGHT  UNICOMPARTMENTAL KNEE (Right)  Patient Location: PACU  Anesthesia Type:MAC, Regional, Spinal and MAC combined with regional for post-op pain  Level of Consciousness: awake, alert , oriented and patient cooperative  Airway & Oxygen Therapy: Patient Spontanous Breathing and Patient connected to nasal cannula oxygen  Post-op Assessment: Report given to RN and Post -op Vital signs reviewed and stable  Post vital signs: Reviewed and stable  Last Vitals:  Filed Vitals:   09/01/14 0611  BP: 122/81  Pulse: 66  Temp: 37 C  Resp: 18    Complications: No apparent anesthesia complications

## 2014-09-01 NOTE — Anesthesia Postprocedure Evaluation (Signed)
  Anesthesia Post-op Note  Patient: Matthew Mason  Procedure(s) Performed: Procedure(s): RIGHT  UNICOMPARTMENTAL KNEE (Right)  Patient Location: PACU  Anesthesia Type:Spinal  Level of Consciousness: awake  Airway and Oxygen Therapy: Patient Spontanous Breathing and Patient connected to nasal cannula oxygen  Post-op Pain: none  Post-op Assessment: Post-op Vital signs reviewed and Patient's Cardiovascular Status Stable  Post-op Vital Signs: Reviewed and stable  Last Vitals:  Filed Vitals:   09/01/14 0611  BP: 122/81  Pulse: 66  Temp: 37 C  Resp: 18    Complications: No apparent anesthesia complications

## 2014-09-01 NOTE — Interval H&P Note (Signed)
History and Physical Interval Note:  09/01/2014 7:10 AM  Matthew Mason  has presented today for surgery, with the diagnosis of right knee primary localized OA  The various methods of treatment have been discussed with the patient and family. After consideration of risks, benefits and other options for treatment, the patient has consented to  Procedure(s): RIGHT  UNICOMPARTMENTAL KNEE (Right) as a surgical intervention .  The patient's history has been reviewed, patient examined, no change in status, stable for surgery.  I have reviewed the patient's chart and labs.  Questions were answered to the patient's satisfaction.     Elsie Saas A

## 2014-09-01 NOTE — Anesthesia Procedure Notes (Addendum)
Spinal Patient location during procedure: holding area Start time: 09/01/2014 7:11 AM End time: 09/01/2014 7:21 AM Staffing Anesthesiologist: Alexis Frock Preanesthetic Checklist Completed: patient identified, site marked, surgical consent, pre-op evaluation, timeout performed, IV checked, risks and benefits discussed and monitors and equipment checked Spinal Block Patient position: sitting Prep: Betadine Patient monitoring: heart rate, cardiac monitor, continuous pulse ox and blood pressure Location: L3-4 Injection technique: single-shot Needle Needle type: Pencil-Tip and Introducer  Needle gauge: 24 G Needle length: 9 cm Assessment Sensory level: T10 Additional Notes Marcaine with epi 14mg  and dextrose  Anesthesia Regional Block:  Adductor canal block  Pre-Anesthetic Checklist: ,, timeout performed, Correct Patient, Correct Site, Correct Laterality, Correct Procedure, Correct Position, site marked, Risks and benefits discussed,  Surgical consent,  Pre-op evaluation,  At surgeon's request and post-op pain management  Laterality: Right  Prep: Maximum Sterile Barrier Precautions used and chloraprep       Needles:  Injection technique: Single-shot  Needle Type: Echogenic Stimulator Needle     Needle Length: 9cm 9 cm Needle Gauge: 21 and 21 G    Additional Needles:  Procedures: ultrasound guided (picture in chart) Adductor canal block Narrative:  Start time: 09/01/2014 7:00 AM End time: 09/01/2014 7:10 AM  Performed by: Personally  Anesthesiologist: Alexis Frock  Additional Notes: 74ml .5% marcaine with epi, talked to patient throughout   Procedure Name: MAC Date/Time: 09/01/2014 7:26 AM Performed by: Carney Living Pre-anesthesia Checklist: Patient identified, Emergency Drugs available, Suction available, Patient being monitored and Timeout performed Patient Re-evaluated:Patient Re-evaluated prior to inductionOxygen Delivery Method: Nasal cannula   AC  block R, 64ml .5% marcaine with epi, multiple asp, talked to patient throughout, no complications

## 2014-09-01 NOTE — Evaluation (Signed)
Physical Therapy Evaluation Patient Details Name: Matthew Mason MRN: [id number] DOB: [birth year] Today's Date: 09/01/2014   History of Present Illness  Patient is a 64 yo male admitted 09/01/14 now s/p Rt uni-compartment knee replacement.  PMH:  Afib, HOH, HTN, arthritis  Clinical Impression  Patient presents with problems listed below.  Will benefit from acute PT to maximize independence prior to d/c home with assist of girlfriend.  Patient did very well with mobility/gait today.  Should progress well with PT.    Follow Up Recommendations Home health PT;Supervision/Assistance - 24 hour    Equipment Recommendations  None recommended by PT    Recommendations for Other Services       Precautions / Restrictions Precautions Precautions: Knee Precaution Booklet Issued: Yes (comment) Precaution Comments: Reviewed precautions with patient. Required Braces or Orthoses: Knee Immobilizer - Right Knee Immobilizer - Right: On when out of bed or walking Restrictions Weight Bearing Restrictions: Yes RLE Weight Bearing: Weight bearing as tolerated      Mobility  Bed Mobility               General bed mobility comments: Patient in chair as PT entered room  Transfers Overall transfer level: Needs assistance Equipment used: Rolling walker (2 wheeled) Transfers: Sit to/from Stand Sit to Stand: Min guard         General transfer comment: Verbal cues for hand placement and technique.  Assist for balance/safety only.  Ambulation/Gait Ambulation/Gait assistance: Min guard Ambulation Distance (Feet): 86 Feet Assistive device: Rolling walker (2 wheeled) Gait Pattern/deviations: Step-to pattern;Decreased stance time - right;Decreased step length - left;Decreased weight shift to right;Antalgic Gait velocity: Decreased Gait velocity interpretation: Below normal speed for age/gender General Gait Details: Verbal cues for safe use of RW and gait sequence.  Assist for safety only.   Patient with slow, guarded gait.  Stairs            Wheelchair Mobility    Modified Rankin (Stroke Patients Only)       Balance                                             Pertinent Vitals/Pain Pain Assessment: 0-10 Pain Score: 4  Pain Location: Rt knee Pain Descriptors / Indicators: Aching;Sore Pain Intervention(s): Monitored during session;Repositioned;Premedicated before session    [place] expects to be discharged to:: Private residence Living Arrangements: Alone Available Help at Discharge: Family;Available 24 hours/day (Significant other) Type of Home: House Home Access: Stairs to enter Entrance Stairs-Rails: None Entrance Stairs-Number of Steps: 3 (3 steps with no rails; or, 3 individual steps at back) Home Layout: Able to live on main level with bedroom/bathroom Home Equipment: Walker - 2 wheels;Bedside commode      Prior Function Level of Independence: Independent               Hand Dominance        Extremity/Trunk Assessment   Upper Extremity Assessment: Overall WFL for tasks assessed           Lower Extremity Assessment: RLE deficits/detail RLE Deficits / Details: Decreased strength and ROM post-op    Cervical / Trunk Assessment: Normal  Communication   Communication: HOH  Cognition Arousal/Alertness: Awake/alert Behavior During Therapy: WFL for tasks assessed/performed Overall Cognitive Status: Within Functional Limits for tasks assessed  General Comments      Exercises Total Joint Exercises Ankle Circles/Pumps: AROM;Both;10 reps;Seated Quad Sets: AROM;Right;10 reps;Seated      Assessment/Plan    PT Assessment Patient needs continued PT services  PT Diagnosis Difficulty walking;Acute pain   PT Problem List Decreased strength;Decreased range of motion;Decreased balance;Decreased mobility;Decreased knowledge of use of DME;Decreased knowledge of  precautions;Pain  PT Treatment Interventions DME instruction;Gait training;Stair training;Functional mobility training;Therapeutic activities;Therapeutic exercise;Patient/family education   PT Goals (Current goals can be found in the Care Plan section) Acute Rehab PT Goals Patient Stated Goal: To go home tomorrow PT Goal Formulation: With patient Time For Goal Achievement: 09/08/14 Potential to Achieve Goals: Good    Frequency 7X/week   Barriers to discharge        Co-evaluation               End of Session Equipment Utilized During Treatment: Gait belt;Right knee immobilizer Activity Tolerance: Patient tolerated treatment well Patient left: in chair;with call bell/phone within reach Nurse Communication: Mobility status         Time: 4656-8127 PT Time Calculation (min) (ACUTE ONLY): 18 min   Charges:   PT Evaluation $Initial PT Evaluation Tier I: 1 Procedure     PT G CodesDespina Pole 2014/09/15, 6:44 PM Carita Pian. Sanjuana Kava, [place] Pager [phone]

## 2014-09-02 DIAGNOSIS — M1711 Unilateral primary osteoarthritis, right knee: Secondary | ICD-10-CM | POA: Diagnosis present

## 2014-09-02 DIAGNOSIS — M67362 Transient synovitis, left knee: Secondary | ICD-10-CM

## 2014-09-02 LAB — CBC
HCT: 35.9 % — ABNORMAL LOW (ref 39.0–52.0)
Hemoglobin: 12.7 g/dL — ABNORMAL LOW (ref 13.0–17.0)
MCH: 31.9 pg (ref 26.0–34.0)
MCHC: 35.4 g/dL (ref 30.0–36.0)
MCV: 90.2 fL (ref 78.0–100.0)
Platelets: 195 10*3/uL (ref 150–400)
RBC: 3.98 MIL/uL — ABNORMAL LOW (ref 4.22–5.81)
RDW: 12.2 % (ref 11.5–15.5)
WBC: 15.1 10*3/uL — ABNORMAL HIGH (ref 4.0–10.5)

## 2014-09-02 LAB — BASIC METABOLIC PANEL
Anion gap: 10 (ref 5–15)
BUN: 11 mg/dL (ref 6–23)
CHLORIDE: 103 mmol/L (ref 96–112)
CO2: 23 mmol/L (ref 19–32)
Calcium: 9.1 mg/dL (ref 8.4–10.5)
Creatinine, Ser: 0.74 mg/dL (ref 0.50–1.35)
GFR calc Af Amer: >90 mL/min (ref >90)
GFR calc non Af Amer: >90 mL/min (ref >90)
Glucose, Bld: 144 mg/dL — ABNORMAL HIGH (ref 70–99)
Potassium: 4.3 mmol/L (ref 3.5–5.1)
SODIUM: 136 mmol/L (ref 135–145)

## 2014-09-02 MED ORDER — DIAZEPAM 2 MG PO TABS: 2.0000 mg | ORAL_TABLET | Freq: Four times a day (QID) | ORAL | Status: DC | PRN

## 2014-09-02 NOTE — Plan of Care (Signed)
Problem: Consults Goal: Diagnosis- Total Joint Replacement Outcome: Completed/Met Date Met:  09/02/14 Partial/Uniknee Right

## 2014-09-02 NOTE — Discharge Summary (Signed)
Orthopaedic Trauma Service (OTS)  Patient ID: Matthew Mason MRN: [id number] DOB/AGE: [birth year] 64 y.o.  Admit date: 09/01/2014 Discharge date: 09/02/2014  Admission Diagnoses: Right Knee DJD, localized  Afib Hearing loss   Discharge Diagnoses:  Principal Problem:   Primary localized osteoarthritis of right knee Active Problems:   Atrial fibrillation, unspecified   Bilateral hearing loss   Arthritis of right knee   Procedures Performed: 09/01/2014- Dr. Noemi Chapel 1. Right knee [place] unicompartmental arthroplasty using cemented size     large femur with cemented size D tibia with #3 polyethylene spacer. 2. Left knee aspiration with cortisone injection   Discharged Condition: good  Hospital Course:   Patient is a 64 year old male admitted on 09/01/2014 for a right unicompartmental knee arthroplasty by Dr. Noemi Chapel. Patient tolerated procedure well and after surgery was transferred to the orthopedic floor for observation, pain control and therapies. Patient's hospital stay was uncomplicated he was admitted for overnight observation only for this outpatient procedure. On postoperative day #1 he was doing fantastic pain was well-controlled tolerating diet and voiding without difficulty. Patient worked with physical therapy on postoperative day #1 and was deemed to be stable for discharge on postoperative day #1. His postoperative drain was pulled on postoperative day #1. His dressing was left in place. He was instructed to leave this dressing in place until follow-up in the office. Questions were encouraged and all questions were answered. Patient discharged in stable condition on postoperative day #1  Consults: None  Significant Diagnostic Studies: labs:   Results for Matthew, Mason (MRN [id number]) as of 09/02/2014 09:09  Ref. Range 08/18/2014 10:59 09/02/2014 06:25  Sodium Latest Range: 135-145 mmol/L  136  Potassium Latest Range: 3.5-5.1 mmol/L  4.3  Chloride Latest Range:  96-112 mmol/L  103  CO2 Latest Range: 19-32 mmol/L  23  BUN Latest Range: 6-23 mg/dL  11  Creatinine Latest Range: 0.50-1.35 mg/dL  0.74  Calcium Latest Range: 8.4-10.5 mg/dL  9.1  GFR calc non Af Amer Latest Range: >90 mL/min  >90  GFR calc Af Amer Latest Range: >90 mL/min  >90  Glucose Latest Range: 70-99 mg/dL  144 (H)  Anion gap Latest Range: 5-15   10  WBC Latest Range: 4.0-10.5 K/uL  15.1 (H)  RBC Latest Range: 4.22-5.81 MIL/uL  3.98 (L)  Hemoglobin Latest Range: 13.0-17.0 g/dL  12.7 (L)  HCT Latest Range: 39.0-52.0 %  35.9 (L)  MCV Latest Range: 78.0-100.0 fL  90.2  MCH Latest Range: 26.0-34.0 pg  31.9  MCHC Latest Range: 30.0-36.0 g/dL  35.4  RDW Latest Range: 11.5-15.5 %  12.2  Platelets Latest Range: 150-400 K/uL  195  Color, Urine Latest Range: YELLOW  YELLOW   APPearance Latest Range: CLEAR  CLEAR   Specific Gravity, Urine Latest Range: 1.005-1.030  1.007   pH Latest Range: 5.0-8.0  6.0   Glucose Latest Range: NEGATIVE mg/dL NEGATIVE   Bilirubin Urine Latest Range: NEGATIVE  NEGATIVE   Ketones, ur Latest Range: NEGATIVE mg/dL NEGATIVE   Protein Latest Range: NEGATIVE mg/dL NEGATIVE   Urobilinogen, UA Latest Range: 0.0-1.0 mg/dL 0.2   Nitrite Latest Range: NEGATIVE  NEGATIVE   Leukocytes, UA Latest Range: NEGATIVE  NEGATIVE   Hgb urine dipstick Latest Range: NEGATIVE  NEGATIVE   MRSA, PCR Latest Range: NEGATIVE  NEGATIVE   Staphylococcus aureus Latest Range: NEGATIVE  NEGATIVE     Treatments: IV hydration, antibiotics: Ancef, analgesia: Dilaudid and oxycodone, anticoagulation: ASA, therapies: PT and ST and surgery: As above  Discharge  Exam:  Orthopaedic Trauma Service Progress Note  Subjective  Pt doing great   Pain tolerable Waiting to work with PT  Voided w/o difficulty this am Ate breakfast w/o any issues as well No other complaints   Review of Systems  Constitutional: Negative for fever and chills.  Respiratory: Negative for shortness of breath and  wheezing.   Cardiovascular: Negative for chest pain and palpitations.  Gastrointestinal: Negative for nausea, vomiting and abdominal pain.  Genitourinary: Negative for dysuria.  Neurological: Negative for tingling, sensory change and headaches.     Objective   BP 139/74 mmHg  Pulse 93  Temp(Src) 98 F (36.7 C) (Oral)  Resp 16  Ht 5\' 8"  (1.727 m)  Wt 86.183 kg (190 lb)  BMI 28.90 kg/m2  SpO2 98%  Intake/Output       03/11 0701 - 03/12 0700 03/12 0701 - 03/13 0700    P.O. 240     I.V. (mL/kg) 1600 (18.6)     Total Intake(mL/kg) 1840 (21.4)     Urine (mL/kg/hr) 3100 (1.5)     Total Output 3100      Net -1260            Urine Occurrence 2 x       Labs  Results for Matthew, Mason (MRN [id number]) as of 09/02/2014 08:49   Ref. Range  09/02/2014 06:25   Sodium  Latest Range: 135-145 mmol/L  136   Potassium  Latest Range: 3.5-5.1 mmol/L  4.3   Chloride  Latest Range: 96-112 mmol/L  103   CO2  Latest Range: 19-32 mmol/L  23   BUN  Latest Range: 6-23 mg/dL  11   Creatinine  Latest Range: 0.50-1.35 mg/dL  0.74   Calcium  Latest Range: 8.4-10.5 mg/dL  9.1   GFR calc non Af Amer  Latest Range: >90 mL/min  >90   GFR calc Af Amer  Latest Range: >90 mL/min  >90   Glucose  Latest Range: 70-99 mg/dL  144 (H)   Anion gap  Latest Range: 5-15   10   WBC  Latest Range: 4.0-10.5 K/uL  15.1 (H)   RBC  Latest Range: 4.22-5.81 MIL/uL  3.98 (L)   Hemoglobin  Latest Range: 13.0-17.0 g/dL  12.7 (L)   HCT  Latest Range: 39.0-52.0 %  35.9 (L)   MCV  Latest Range: 78.0-100.0 fL  90.2   MCH  Latest Range: 26.0-34.0 pg  31.9   MCHC  Latest Range: 30.0-36.0 g/dL  35.4   RDW  Latest Range: 11.5-15.5 %  12.2   Platelets  Latest Range: 150-400 K/uL  195     Exam  Gen: awake and alert, sitting up in bed, NAD Lungs: clear B   Cardiac: s1 and s2, RRR Abd: + BS, NTND Ext:        Right Lower Extremity               Dressing c/d/i             Drain has been pulled             Pt in zero knee  bone foam             DPN, SPN, TN sensation intact             EHL, FHL, AT, PT, peroneals, gastroc motor intact             + quad set  Ext warm             Compartments soft             + DP pulse    Assessment and Plan   POD/HD#: 1   64 y/o male with endstage arthritis R knee, medial compartment  1. endstage DJD R knee, medial compartment s/p R unicompartmental knee arthroplasty               WBAT             ROM as tolerated             No knee immobilizer needed             F/u with Dr. Noemi Chapel on 09/07/2014             Leave dressing on until follow up               PT eval this am before dc   2. DVT/PE prophylaxis             ASA   3. Medical issues             Home meds  4. Pain management             Oxy IR               Valium   5. Dispo             Dc home today             Follow up with Dr. Noemi Chapel on Thursday             Pt listed as outpt with extended recovery                  Jari Pigg, PA-C [facility] [phone] (O) 09/02/2014 8:48 AM   Disposition: 01-Home or Self Care      Discharge Instructions    Call MD / Call 911    Complete by:  As directed   If you experience chest pain or shortness of breath, CALL 911 and be transported to the hospital emergency room.  If you develope a fever above 101 F, pus (white drainage) or increased drainage or redness at the wound, or calf pain, call your surgeon's office.     Change dressing    Complete by:  As directed   DO NOT REMOVE BANDAGE OVER SURGICAL INCISION.  [place] WHOLE LEG INCLUDING OVER THE WATERPROOF BANDAGE WITH SOAP AND WATER EVERY DAY.     Constipation Prevention    Complete by:  As directed   Drink plenty of fluids.  Prune juice may be helpful.  You may use a stool softener, such as Colace (over the counter) 100 mg twice a day.  Use MiraLax (over the counter) for constipation as needed.     Diet - low sodium heart healthy    Complete by:  As  directed      Discharge instructions    Complete by:  As directed   Partial Knee Replacement Care After Refer to this sheet in the next few weeks. These discharge instructions provide you with general information on caring for yourself after you leave the hospital. Your caregiver may also give you specific instructions. Your treatment has been planned according to the most current medical practices available, but unavoidable complications sometimes occur. If you have any problems or questions after discharge, please call  your caregiver. Regaining a near full range of motion of your knee within the first 3 to 6 weeks after surgery is critical. [place] may resume a normal diet and activities as directed.  Perform exercises as directed.  Place gray foam block, curve side up under heel at all times except when in CPM or when walking.  DO NOT modify, tear, cut, or change in any way the gray foam block. You will receive physical therapy daily  Take showers instead of baths until informed otherwise.  DO NOT REMOVE BANDAGE OVER SURGICAL INCISION.  [place] WHOLE LEG INCLUDING OVER THE WATERPROOF BANDAGE WITH SOAP AND WATER EVERY DAY. Do not take over-the-counter or prescription medicines for pain, discomfort, or fever. Eat a well-balanced diet.  Avoid lifting or driving until you are instructed otherwise.  Make an appointment to see your caregiver for stitches (suture) or staple removal as directed.  If you have been sent home with a continuous passive motion machine (CPM machine), 0-90 degrees 6 hrs a day   2 hrs a shift SEEK MEDICAL CARE IF: You have swelling of your calf or leg.  You develop shortness of breath or chest pain.  You have redness, swelling, or increasing pain in the wound.  There is pus or any unusual drainage coming from the surgical site.  You notice a bad smell coming from the surgical site or dressing.  The surgical site breaks open after sutures or staples have  been removed.  There is persistent bleeding from the suture or staple line.  You are getting worse or are not improving.  You have any other questions or concerns.  SEEK IMMEDIATE MEDICAL CARE IF:  You have a fever.  You develop a rash.  You have difficulty breathing.  You develop any reaction or side effects to medicines given.  Your knee motion is decreasing rather than improving.  MAKE SURE YOU:  Understand these instructions.  Will watch your condition.  Will get help right away if you are not doing well or get worse.     Do not put a pillow under the knee. Place it under the heel.    Complete by:  As directed   Place yellow foam block, yellow side up under heel at all times except when in CPM or when walking.  DO NOT modify, tear, cut, or change in any way the yellow foam block.     Increase activity slowly as tolerated    Complete by:  As directed      TED hose    Complete by:  As directed   Use stockings (TED hose) for 2 weeks on both leg(s).  You may remove them at night for sleeping.            Medication List    STOP taking these medications        aspirin 81 MG tablet  Replaced by:  aspirin EC 325 MG tablet      TAKE these medications        aspirin EC 325 MG tablet  1 tab a day for the next 30 days to prevent blood clots     diazepam 2 MG tablet  Commonly known as:  VALIUM  Take 1 tablet (2 mg total) by mouth every 6 (six) hours as needed for anxiety or muscle spasms.     diltiazem 120 MG 24 hr capsule  Commonly known as:  DILACOR XR  Take 120 mg by mouth daily.  oxyCODONE 5 MG immediate release tablet  Commonly known as:  Oxy IR/ROXICODONE  1-2 tablets every 4-6 hrs as needed for pain     vardenafil 20 MG tablet  Commonly known as:  LEVITRA  Take 1 tablet (20 mg total) by mouth daily as needed for erectile dysfunction.       Follow-up Information    Follow up with Lorn Junes, MD On 09/07/2014.   Specialty:  Orthopedic Surgery   Why:   appt time 2:45 pm   Contact information:   [place] [address] [phone]        Signed:  Jari Pigg, PA-C [facility] [phone] (P) 09/02/2014, 9:12 AM

## 2014-09-02 NOTE — Discharge Instructions (Signed)
Partial Knee Replacement  Care After Refer to this sheet in the next few weeks. These discharge instructions provide you with general information on caring for yourself after you leave the hospital. Your caregiver may also give you specific instructions. Your treatment has been planned according to the most current medical practices available, but unavoidable complications sometimes occur. If you have any problems or questions after discharge, please call your caregiver. Regaining a near full range of motion of your knee within the first 3 to 6 weeks after surgery is critical.  [place] may resume a normal diet and activities as directed.  Perform exercises as directed.  Place gray foam block, curve side up under heel at all times except when in CPM or when walking.  DO NOT modify, tear, cut, or change in any way the gray foam block. You will receive physical therapy daily  Take showers instead of baths until informed otherwise.  DO NOT REMOVE BANDAGE OVER SURGICAL INCISION.  [place] WHOLE LEG INCLUDING OVER THE WATERPROOF BANDAGE WITH SOAP AND WATER EVERY DAY. Do not take over-the-counter or prescription medicines for pain, discomfort, or fever. Eat a well-balanced diet.  Avoid lifting or driving until you are instructed otherwise.  Make an appointment to see your caregiver for stitches (suture) or staple removal as directed.  If you have been sent home with a continuous passive motion machine (CPM machine), 0-90 degrees 6 hrs a day   2 hrs a shift  SEEK MEDICAL CARE IF: You have swelling of your calf or leg.  You develop shortness of breath or chest pain.  You have redness, swelling, or increasing pain in the wound.  There is pus or any unusual drainage coming from the surgical site.  You notice a bad smell coming from the surgical site or dressing.  The surgical site breaks open after sutures or staples have been removed.  There is persistent bleeding from the suture or  staple line.  You are getting worse or are not improving.  You have any other questions or concerns.   SEEK IMMEDIATE MEDICAL CARE IF:  You have a fever.  You develop a rash.  You have difficulty breathing.  You develop any reaction or side effects to medicines given.  Your knee motion is decreasing rather than improving.  MAKE SURE YOU:  Understand these instructions.  Will watch your condition.  Will get help right away if you are not doing well or get worse.

## 2014-09-02 NOTE — Evaluation (Signed)
Occupational Therapy Evaluation Patient Details Name: Matthew Mason MRN: [id number] DOB: October 09, 1950 Today's Date: 09/02/2014    History of Present Illness Patient is a 64 yo male admitted 09/01/14 now s/p Rt uni-compartment knee replacement.  PMH:  Afib, HOH, HTN, arthritis   Clinical Impression   Pt s/p above. Education provided to pt and significant other. Feel pt is safe to d/c home, from OT standpoint.    Follow Up Recommendations  No OT follow up;Supervision - Intermittent    Equipment Recommendations  None recommended by OT    Recommendations for Other Services       Precautions / Restrictions Precautions Precautions: Knee Precaution Booklet Issued: No Precaution Comments: Reviewed precautions Required Braces or Orthoses:  (Immobilizer DC by physician this AM.) Knee Immobilizer - Right:  (physician DC immobilizer this AM) Restrictions Weight Bearing Restrictions: Yes RLE Weight Bearing: Weight bearing as tolerated      Mobility Bed Mobility             General bed mobility comments: not assessed  Transfers Overall transfer level: Modified independent Transfers: Sit to/from Stand Sit to Stand: Modified independent (Device/Increase time)         General transfer comment: pt verbalized technique and OT agreed. Pt able to also demonstrate safe technique.    Balance  No LOB in session. Mod I for ambulation with RW.                                          ADL Overall ADL's : Needs assistance/impaired                     Lower Body Dressing: Set up;Sit to/from stand   Toilet Transfer: Modified Independent;Ambulation           Functional mobility during ADLs: Modified independent;Rolling walker (educated on technique for backing up) General ADL Comments: Educated on shower transfer techniques and suggested using 3 in 1 as shower chair. Educated on LB dressing technique and explained benefit of pt reaching down to  right foot for LB dressing as it allows knee to bend. Explained sockaid is available. Educated on safety such as use of bag on walker, safe shoewear, rugs/items on floor, and recommended someone being with him for shower transfer.  Recommended sitting for most of LB ADLs.     Vision     Perception     Praxis      Pertinent Vitals/Pain Pain Assessment: 0-10 Pain Score: 2  Pain Location: Rt knee Pain Intervention(s): Premedicated before session;Monitored during session     Hand Dominance     Extremity/Trunk Assessment Upper Extremity Assessment Upper Extremity Assessment: Overall WFL for tasks assessed   Lower Extremity Assessment Lower Extremity Assessment: Defer to PT evaluation       Communication Communication Communication: No difficulties   Cognition Arousal/Alertness: Awake/alert Behavior During Therapy: WFL for tasks assessed/performed Overall Cognitive Status: Within Functional Limits for tasks assessed                     General Comments          Shoulder Instructions      Home Living Family/patient expects to be discharged to:: Private residence Living Arrangements: Alone Available Help at Discharge: Family;Available 24 hours/day (significant other) Type of Home: House Home Access: Stairs to enter [employer] of Steps: 3 (3 steps  with no rails, or 3 individual steps at back) Entrance Stairs-Rails: None Home Layout: Able to live on main level with bedroom/bathroom     Bathroom Shower/Tub: Walk-in shower;Door         Home Equipment: Walker - 2 wheels;Bedside commode          Prior Functioning/Environment Level of Independence: Independent             OT Diagnosis: Acute pain   OT Problem List:     OT Treatment/Interventions:      OT Goals(Current goals can be found in the care plan section)   OT Frequency:     Barriers to D/C:            Co-evaluation              End of Session Equipment Utilized  During Treatment: Rolling walker CPM Left Knee CPM Left Knee: Off CPM Right Knee CPM Right Knee: Off  Activity Tolerance: Patient tolerated treatment well Patient left: in chair;with call bell/phone within reach;with family/visitor present   Time: 1139-1150 OT Time Calculation (min): 11 min Charges:  OT General Charges $OT Visit: 1 Procedure OT Evaluation $Initial OT Evaluation Tier I: 1 Procedure G-Codes: OT G-codes **NOT FOR INPATIENT CLASS** Functional Assessment Tool Used: clinical judgment Functional Limitation: Self care Self Care Current Status (V6720): At least 1 percent but less than 20 percent impaired, limited or restricted Self Care Goal Status (N4709): At least 1 percent but less than 20 percent impaired, limited or restricted Self Care Discharge Status [phone]): At least 1 percent but less than 20 percent impaired, limited or restricted  Benito Mccreedy OTR/L [phone] 09/02/2014, 12:15 PM

## 2014-09-02 NOTE — Progress Notes (Signed)
[facility] Progress Note  Subjective  Pt doing great  Pain tolerable Waiting to work with PT  Voided w/o difficulty this am Ate breakfast w/o any issues as well No other complaints   Review of Systems  Constitutional: Negative for fever and chills.  Respiratory: Negative for shortness of breath and wheezing.   Cardiovascular: Negative for chest pain and palpitations.  Gastrointestinal: Negative for nausea, vomiting and abdominal pain.  Genitourinary: Negative for dysuria.  Neurological: Negative for tingling, sensory change and headaches.     Objective   BP 139/74 mmHg  Pulse 93  Temp(Src) 98 F (36.7 C) (Oral)  Resp 16  Ht 5\' 8"  (1.727 m)  Wt 86.183 kg (190 lb)  BMI 28.90 kg/m2  SpO2 98%  Intake/Output      03/11 0701 - 03/12 0700 03/12 0701 - 03/13 0700   P.O. 240    I.V. (mL/kg) 1600 (18.6)    Total Intake(mL/kg) 1840 (21.4)    Urine (mL/kg/hr) 3100 (1.5)    Total Output 3100     Net -1260          Urine Occurrence 2 x      Labs  Results for LAMARK, SCHUE (MRN [id number]) as of 09/02/2014 08:49  Ref. Range 09/02/2014 06:25  Sodium Latest Range: 135-145 mmol/L 136  Potassium Latest Range: 3.5-5.1 mmol/L 4.3  Chloride Latest Range: 96-112 mmol/L 103  CO2 Latest Range: 19-32 mmol/L 23  BUN Latest Range: 6-23 mg/dL 11  Creatinine Latest Range: 0.50-1.35 mg/dL 0.74  Calcium Latest Range: 8.4-10.5 mg/dL 9.1  GFR calc non Af Amer Latest Range: >90 mL/min >90  GFR calc Af Amer Latest Range: >90 mL/min >90  Glucose Latest Range: 70-99 mg/dL 144 (H)  Anion gap Latest Range: 5-15  10  WBC Latest Range: 4.0-10.5 K/uL 15.1 (H)  RBC Latest Range: 4.22-5.81 MIL/uL 3.98 (L)  Hemoglobin Latest Range: 13.0-17.0 g/dL 12.7 (L)  HCT Latest Range: 39.0-52.0 % 35.9 (L)  MCV Latest Range: 78.0-100.0 fL 90.2  MCH Latest Range: 26.0-34.0 pg 31.9  MCHC Latest Range: 30.0-36.0 g/dL 35.4  RDW Latest Range: 11.5-15.5 % 12.2  Platelets Latest Range: 150-400  K/uL 195    Exam  Gen: awake and alert, sitting up in bed, NAD Lungs: clear B  Cardiac: s1 and s2, RRR Abd: + BS, NTND Ext:       Right Lower Extremity   Dressing c/d/i  Drain has been pulled  Pt in zero knee bone foam  DPN, SPN, TN sensation intact  EHL, FHL, AT, PT, peroneals, gastroc motor intact  + quad set  Ext warm  Compartments soft  + DP pulse    Assessment and Plan   POD/HD#: 1   64 y/o male with endstage arthritis R knee, medial compartment  1. endstage DJD R knee, medial compartment s/p R unicompartmental knee arthroplasty   WBAT  ROM as tolerated  No knee immobilizer needed  F/u with Dr. Noemi Chapel on 09/07/2014  Leave dressing on until follow up   PT eval this am before dc   2. DVT/PE prophylaxis  ASA   3. Medical issues  Home meds  4. Pain management  Oxy IR   Valium   5. Dispo  Dc home today  Follow up with Dr. Noemi Chapel on Thursday  Pt listed as outpt with extended recovery      Jari Pigg, PA-C [facility] [phone] (O) 09/02/2014 8:48 AM

## 2014-09-02 NOTE — Op Note (Signed)
NAME:  Matthew Mason, Matthew Mason NO.:  [id number]  MEDICAL RECORD NO.:  [id number]  LOCATION:  5N07C                        FACILITY:  [facility]  PHYSICIAN:  Audree Camel. Noemi Chapel, M.D. DATE OF BIRTH:  1951/06/22  DATE OF PROCEDURE:  09/01/2014 DATE OF DISCHARGE:                              OPERATIVE REPORT   PREOPERATIVE DIAGNOSES: 1. Right knee medial compartment primary localized osteoarthritis. 2. Left knee acute nontraumatic synovitis.  POSTOPERATIVE DIAGNOSES: 1. Right knee medial compartment primary localized osteoarthritis. 2. Left knee acute nontraumatic synovitis.  PROCEDURES: 1. Right knee [place] unicompartmental arthroplasty using cemented size     large femur with cemented size D tibia with #3 polyethylene spacer. 2. Left knee aspiration with cortisone injection.  SURGEON:  Audree Camel. Noemi Chapel, M.D.  ASSISTANT:  Matthew Saras, PA-C  ANESTHESIA:  Spinal.  OPERATIVE TIME:  One hour and 40 minutes.  COMPLICATIONS:  None.  INDICATION FOR PROCEDURE:  Matthew Mason is a 64 year old gentleman who has had significant right knee pain with recurrent medial compartment osteoarthritis who has failed conservative care and is now to undergo medial compartment unicompartmental arthroplasty.  He also has acute onset of significant swelling in his left knee and will undergo left knee aspiration and injection.  DESCRIPTION OF PROCEDURE:  Matthew Mason was brought to the operating room on September 01, 2014 after an adductor canal block was placed in the holding room by Anesthesia.  He was placed on the operative table in supine position.  After undergoing spinal anesthetic block by Anesthesia, both knees were examined.  Range of motion of both knees showed -5 to 125 degrees, moderate varus deformity.  Knees were stable ligamentous exam with normal patellar tracking.  The left knee was sterilely aspirated and 80 mL of serous fluid was aspirated from the left knee and injected  with 80 mg of Depo-Medrol and 10 mL of 0.25% Marcaine.  The right leg was then prepped using sterile DuraPrep and draped using sterile technique.  Time-out of procedure was called, the correct right knee was identified.  The right leg was exsanguinated and a thigh tourniquet elevated 325 mm.  Initially, through a 6-cm anterior incision, an initial exposure was made.  The underlying subcutaneous tissues were incised along with the skin incision.  Median arthrotomy was performed.  The medial compartment was inspected.  He had grade 4 changes in the medial compartment.  The lateral compartment showed no articular cartilage damage.  Patellofemoral joint showed 25-30% grade 3 changes.  ACL, PCL, medial, lateral, and collateral ligaments were normal.  At this point, the medial meniscus was removed.  A large sizing spoon was placed in the medial compartment, and this was used to attach the proximal tibial cutting jig, which was placed in the appropriate amount of rotation and the proximal tibial cut was made.  The portion removed, was used to a size the tibial component, which was a size D. After this was done, then the intramedullary rod was placed up the femoral canal and this was used to place the femoral cutting jig, which was placed in the appropriate manner rotation and 2 drill holes were made through this jig and then a 0 spigot was placed  through this femoral hole, which had been placed through the jig and this first reaming was carried out.  After this was done and with the size D, tibial component in place and the large femoral trial, a #3 paddle was placed in flexion, which gave excellent position and extension, and there was no space and thus a size 3 spigot was replaced on the distal femur and then re-reamed.  After this was done, the trials were then placed again and the #3 spacer was found to be an excellent fit in flexion and extension.  At this point, then the tibial keel was cut  with the tibial position room place and then the femoral impingement reaming and posterior reaming was placed and used as well.  After this was done and the trials were replaced and with a #3 polyethylene spacer, there was found to be excellent stability, excellent correction of his varus deformity with excellent tracking of the polyethylene spacer.  At this point it was felt that all the trial components were of excellent size, fit, and stability.  They were then removed.  The knee was then jet lavage with 3 L of saline.  The proximal tibia was then exposed and size D tibial component with cement backing was hammered into position with an excellent fit with excess cement being removed from around the edges. The large femoral trial with cement backing was hammered into position also with an excellent fit with excess cement being removed from around the edges.  The #3 polyethylene spacer was then placed again.  Knee brought through a full range of motion, found to be stable with excellent position of the components, excellent stability as well. After the cement hardened, again brought through full range of motion and found to be stable with excellent position of the components.  At this point, it was felt that all pathology had been satisfactorily addressed.  The wound was further irrigated and then the arthrotomy was closed with #1 Ethibond running suture over a medium Hemovac drain. Subcutaneous tissues were closed with 0 and 2-0 Vicryl, subcuticular layer closed with 4-0 Monocryl.  Sterile dressings were applied. Tourniquet was released and then the patient awakened and taken to recovery room in stable condition.  Needle, sponge counts correct x2 at the end the case.  FOLLOWUP CARE:  Matthew Mason will be followed overnight for observation as an outpatient with observation.  He will be discharged tomorrow on OxyIR and Valium.  See him back in the office in a week for sutures out and  followup.     Omie Ferger A. Noemi Chapel, M.D.     RAW/MEDQ  D:  09/02/2014  T:  09/02/2014  Job:  [id number]

## 2014-09-02 NOTE — Progress Notes (Signed)
Physical Therapy Treatment Patient Details Name: Matthew Mason MRN: [id number] DOB: December 07, 1950 Today's Date: 09/02/2014    History of Present Illness Patient is a 64 yo male admitted 09/01/14 now s/p Rt uni-compartment knee replacement.  PMH:  Afib, HOH, HTN, arthritis    PT Comments    Patient agreeable to therapy, states this morning MD said he did not need knee immobilizer anymore (confirmed in notes).  Patient Supervision to MOD Independent overall, with excellent ROM and strength in R knee.  Stair training completed, with MIN Guard.  Antalgia with gait, but able to demonstrate step-through technique.  Patient cleared for independent ambulation with RW in hospital.  Making progress towards goals, clear for DC home per medical, will continue on PT services while in hospital.  Follow Up Recommendations  Home health PT;Supervision/Assistance - 24 hour     Equipment Recommendations  None recommended by PT    Recommendations for Other Services       Precautions / Restrictions Precautions Precautions: Knee Precaution Comments: Reviewed precautions with patient. Required Braces or Orthoses:  (Immobilizer DC by physician this AM.) Knee Immobilizer - Right:  (physician DC immobilizer this AM) Restrictions Weight Bearing Restrictions: Yes RLE Weight Bearing: Weight bearing as tolerated    Mobility  Bed Mobility Overal bed mobility: Modified Independent                Transfers Overall transfer level: Needs assistance Equipment used: Rolling walker (2 wheeled) Transfers: Sit to/from Stand Sit to Stand: Supervision;Modified independent (Device/Increase time)         General transfer comment: Supervision for safety / technique only  Ambulation/Gait Ambulation/Gait assistance: Supervision;Modified independent (Device/Increase time) Ambulation Distance (Feet): 300 Feet Assistive device: Rolling walker (2 wheeled) (mock of SPC via L UE use of RW only, MIN guard.) Gait  Pattern/deviations: Step-through pattern;Antalgic Gait velocity: Decreased Gait velocity interpretation: Below normal speed for age/gender General Gait Details: Initial cues for step through gait pattern only, slower speed.   Stairs Stairs: Yes Stairs assistance: Min guard Stair Management: Two rails;Alternating pattern Number of Stairs: 9 General stair comments: Instruction in various methods for pain as tolerated.  Wheelchair Mobility    Modified Rankin (Stroke Patients Only)       Balance                                    Cognition Arousal/Alertness: Awake/alert Behavior During Therapy: WFL for tasks assessed/performed Overall Cognitive Status: Within Functional Limits for tasks assessed                      Exercises Total Joint Exercises Ankle Circles/Pumps: AROM;Both;10 reps;Seated Quad Sets: AROM;Right;10 reps;Seated Heel Slides: AAROM;10 reps;Supine;Right Straight Leg Raises: AROM;Right;15 reps;Supine Long Arc Quad: AAROM;Right;5 reps;Seated Knee Flexion: AAROM;AROM;Right;5 reps;Seated Goniometric ROM: 0-105 AROM, 0-110 AAROM    General Comments        Pertinent Vitals/Pain Pain Assessment: 0-10 Pain Score: 4  Pain Location: Rt kne Pain Descriptors / Indicators: Aching;Sore Pain Intervention(s): Monitored during session    Home Living                      Prior Function            PT Goals (current goals can now be found in the care plan section) Acute Rehab PT Goals Patient Stated Goal: To go home PT Goal Formulation: With patient Time  For Goal Achievement: 09/08/14 Potential to Achieve Goals: Good Progress towards PT goals: Progressing toward goals    Frequency  7X/week    PT Plan Current plan remains appropriate    Co-evaluation             End of Session Equipment Utilized During Treatment: Gait belt;Right knee immobilizer Activity Tolerance: Patient tolerated treatment well Patient left: in  chair;with call bell/phone within reach;with family/visitor present     Time: 6269-4854 PT Time Calculation (min) (ACUTE ONLY): 50 min  Charges:  $Gait Training: 23-37 mins $Therapeutic Exercise: 8-22 mins                    G Codes:      [name] L 2014/09/14, 10:54 AM

## 2014-09-04 NOTE — Progress Notes (Signed)
CARE MANAGEMENT NOTE 09/04/2014  Patient:  Matthew Mason, Matthew Mason   Account Number:  [id number]  Date Initiated:  09/04/2014  Documentation initiated by:  [facility]  Subjective/Objective Assessment:   s/p unicompartmental arthroplasty     Action/Plan:   PT /OT evals- recokmended HHPT   Anticipated DC Date:     Anticipated DC Plan:        DC Planning Services  CM consult      PAC Choice  [place]   Choice offered to / List presented to:     DME arranged  3-N-1  [facility]      DME agency  [employer]     [place] arranged  HH-2 PT      [place]   Status of service:  Completed, signed off Medicare Important Message given?    Discharge Disposition:  [place]   Comments:  09/04/14 Patient discharged 09/02/14. Contacted patient, he stated that he was set up with [facility] for HHPT and that T and [place] had provided his CPM and 3N1. He stated that he already had a rolling walker.

## 2014-09-04 NOTE — Progress Notes (Signed)
Physical Therapy late note G-codes   26-Sep-2014 1838  PT G-Codes **NOT FOR INPATIENT CLASS**  Functional Assessment Tool Used Clinical judgement  Functional Limitation Mobility: Walking and moving around  Mobility: Walking and Moving Around Current Status [phone]) CI  Mobility: Walking and Moving Around Goal Status [phone]) CI  Carita Pian. Sanjuana Kava, [place] Pager [phone]

## 2014-09-05 ENCOUNTER — Encounter (HOSPITAL_COMMUNITY): Payer: Self-pay | Admitting: Orthopedic Surgery

## 2014-11-14 ENCOUNTER — Ambulatory Visit (HOSPITAL_COMMUNITY): Payer: Federal, State, Local not specified - PPO | Attending: Cardiovascular Disease

## 2014-11-14 ENCOUNTER — Other Ambulatory Visit (HOSPITAL_COMMUNITY): Payer: Self-pay | Admitting: *Deleted

## 2014-11-14 DIAGNOSIS — M79604 Pain in right leg: Secondary | ICD-10-CM | POA: Diagnosis not present

## 2017-03-19 ENCOUNTER — Ambulatory Visit: Payer: Federal, State, Local not specified - PPO | Admitting: Cardiology

## 2017-03-30 ENCOUNTER — Telehealth: Payer: Self-pay | Admitting: Cardiology

## 2017-03-30 ENCOUNTER — Ambulatory Visit (INDEPENDENT_AMBULATORY_CARE_PROVIDER_SITE_OTHER): Payer: Federal, State, Local not specified - PPO

## 2017-03-30 ENCOUNTER — Ambulatory Visit (INDEPENDENT_AMBULATORY_CARE_PROVIDER_SITE_OTHER): Payer: Federal, State, Local not specified - PPO | Admitting: Cardiology

## 2017-03-30 ENCOUNTER — Encounter: Payer: Self-pay | Admitting: Cardiology

## 2017-03-30 VITALS — BP 122/84 | HR 90 | Ht 68.0 in | Wt 180.6 lb

## 2017-03-30 DIAGNOSIS — I480 Paroxysmal atrial fibrillation: Secondary | ICD-10-CM

## 2017-03-30 NOTE — Assessment & Plan Note (Signed)
Recurrent Afib - & by Sx, more frequent spells.    30 d monitor to check burden of Afib - may wan to consider rhythm control (or EP evaluation for ? Ablation)  ASA for now, but dependent upon burden, may consider restarting NOAC.

## 2017-03-30 NOTE — Telephone Encounter (Signed)
Spoke with pt he states that this was when he had his monitor placed @ about 1130am at the [address] office, he states that they asked him if he was currently having any symptoms and then told him to push the button. Will inform Dr Ellyn Hack and give him report for review.

## 2017-03-30 NOTE — Telephone Encounter (Signed)
New Message  Crystal from [employer] call with a Critical EKG report. Please call back to discuss

## 2017-03-30 NOTE — Telephone Encounter (Signed)
REVIEWED WITH DR HARDING. PATIENT WAS SEEN TODAY AWARE PATIENT IS IN AFIB, MONITOR IS BEING USED TO  SHOW FOR WHAT PERIOD OF TIME PATIENT IS IN AFIB FOR THE MONTH.

## 2017-03-30 NOTE — Patient Instructions (Signed)
SCHEDULE AT [facility] has recommended that you wear an event monitor 30 DAY. Event monitors are medical devices that record the heart's electrical activity. Doctors most often [place] these monitors to diagnose arrhythmias. Arrhythmias are problems with the speed or rhythm of the heartbeat. The monitor is a small, portable device. You can wear one while you do your normal daily activities. This is usually used to diagnose what is causing palpitations/syncope (passing out).   NO CHANGE WITH MEDICATION AT PRESENT TIME      Your physician recommends that you schedule a follow-up appointment in 2 MONTHS WITH DR HARDING.

## 2017-03-30 NOTE — Progress Notes (Signed)
PCP: Denita Lung, MD  Clinic Note: Chief Complaint  Patient presents with  . Follow-up  . Palpitations  . Atrial Fibrillation    HPI: Matthew Mason is a 66 y.o. male with a PMH below who presents today for follow-up with history of PAF (lone A. fib). No longer taking ELIQUIS. Had not had any recurrences since 2012 and 2015.  We stopped his diltiazem because of fatigue and lack of recurrence.  Matthew Mason was last seen in January 2016 for preoperative evaluation  Recent Hospitalizations: none  Studies Personally Reviewed - (if available, images/films reviewed: From Epic Chart or Care Everywhere)  None  Interval History: Matthew Mason returns today mostly as a callback without any significant cardiac symptoms. He does note that he has some somewhat irregular fast heart beats or times a week. Otherwise, he denies any lightheadedness, dizziness or wooziness associated with the symptoms. When he does note some occasional positional dizziness if he stands up too fast. He denies any heart failure symptoms of PND, orthopnea or edema. No chest tightness or pressure with rest or exertion exertional dyspnea.   No syncope/near syncope, or TIA/amaurosis fugax symptoms. No melena, hematochezia, hematuria, or epstaxis. No claudication.  ROS: A comprehensive was performed. Review of Systems  Constitutional: Negative for malaise/fatigue.  HENT: Negative for congestion and nosebleeds.   Respiratory: Negative for cough, sputum production and wheezing.   Cardiovascular: Positive for palpitations. Negative for chest pain.  Gastrointestinal: Negative for blood in stool and melena.  Genitourinary: Negative for hematuria.  Musculoskeletal: Negative for falls and joint pain.  Endo/Heme/Allergies: Does not bruise/bleed easily.  Psychiatric/Behavioral: Negative for depression and memory loss. The patient is not nervous/anxious and does not have insomnia.   All other systems reviewed and are  negative.  I have reviewed and (if needed) personally updated the patient's problem list, medications, allergies, past medical and surgical history, social and family history.   Past Medical History:  Diagnosis Date  . Arthritis    knee  . Dysrhythmia   . Hemorrhoids   . HOH (hard of hearing)    Bilateral hearing aids  . Hypertension    Dr. Ellyn Hack took patient off BP meds  . Paroxysmal atrial fibrillation ([place]) 2012   Several short episodes on Event Monitor aftrer initial hospitalization. -- On PRN Diltiazem & ASA  . Personal history of colonic polyps     Past Surgical History:  Procedure Laterality Date  . COLONOSCOPY  01/07   Dr. Olevia Perches  . KNEE ARTHROSCOPY Right   . NM MYOVIEW LTD  June 2012   Treadmill: Walks 11.5 minutes, 12 METs, no ischemia or infarction  . PARTIAL KNEE ARTHROPLASTY Right 09/01/2014   Procedure: RIGHT  UNICOMPARTMENTAL KNEE;  Surgeon: Elsie Saas, MD;  Location: [facility];  Service: Orthopedics;  Laterality: Right;  . TRANSTHORACIC ECHOCARDIOGRAM  June 2012    Normal LV size and function, EF>55%,Gr 1 DD, no regional WMA, Trace MR.    Current Meds  Medication Sig  . aspirin EC 325 MG tablet 1 tab a day for the next 30 days to prevent blood clots    No Known Allergies  Social History   Social History  . Marital status: Single    Spouse name: N/A  . Number of children: N/A  . Years of education: N/A   Social History Main Topics  . Smoking status: Former Smoker    Quit date: 06/23/1998  . Smokeless tobacco: Never Used  . Alcohol use 15.0 oz/week  25 Cans of beer per week     Comment: 3-4 times a week  . Drug use: No  . Sexual activity: Not Currently   Other Topics Concern  . None   Social History Narrative   Divorced father of 2, grandfather and 3. Notably reduced social stressors since divorce.   Exercises routinely, walks 4-5 times a week. He also plays tennis several days a week. Occasionally smokes cigars and has a social beer.  Otherwise nothing significant.    family history includes Cancer (age of onset: 24) in his brother; Heart disease in his mother; Heart disease (age of onset: 76) in his father.  Wt Readings from Last 3 Encounters:  03/30/17 180 lb 9.6 oz (81.9 kg)  09/01/14 190 lb (86.2 kg)  08/18/14 190 lb 14.4 oz (86.6 kg)    PHYSICAL EXAM BP 122/84   Pulse 90   Ht 5\' 8"  (1.727 m)   Wt 180 lb 9.6 oz (81.9 kg)   BMI 27.46 kg/m  Physical Exam  Constitutional: He is oriented to person, place, and time. He appears well-developed and well-nourished. No distress.  HENT:  Head: Normocephalic and atraumatic.  Neck: Normal range of motion. No JVD present. No thyromegaly present.  Cardiovascular: Normal rate and intact distal pulses.  An irregularly irregular rhythm present. PMI is not displaced.  Exam reveals no gallop and no friction rub.   No murmur heard. Pulmonary/Chest: Effort normal and breath sounds normal. No respiratory distress. He has no wheezes. He has no rales.  Abdominal: Soft. Bowel sounds are normal. He exhibits no distension. There is no tenderness. There is no rebound.  Musculoskeletal: Normal range of motion. He exhibits no edema.  Neurological: He is alert and oriented to person, place, and time.  Skin: Skin is warm and dry. No rash noted. No erythema.  Psychiatric: He has a normal mood and affect. Judgment and thought content normal.  Nursing note and vitals reviewed.     Adult ECG Report  Rate: 90 ;  Rhythm: atrial fibrillation and Otherwise normal axis, intervals and durations;   Narrative Interpretation: Otherwise normal EKG   Other studies Reviewed: Additional studies/ records that were reviewed today include:  Recent Labs:  None available.    ASSESSMENT / PLAN: Problem List Items Addressed This Visit    PAF (paroxysmal atrial fibrillation) (HCC) - Primary (Chronic)    Recurrent Afib - & by Sx, more frequent spells.    30 d monitor to check burden of Afib - may wan  to consider rhythm control (or EP evaluation for ? Ablation)  ASA for now, but dependent upon burden, may consider restarting NOAC.      Relevant Orders   CARDIAC EVENT MONITOR   EKG 12-Lead (Completed)      Current medicines are reviewed at length with the patient today. (+/- concerns) n/a The following changes have been made: n/a  Patient Instructions  SCHEDULE AT [facility] has recommended that you wear an event monitor 30 DAY. Event monitors are medical devices that record the heart's electrical activity. Doctors most often [place] these monitors to diagnose arrhythmias. Arrhythmias are problems with the speed or rhythm of the heartbeat. The monitor is a small, portable device. You can wear one while you do your normal daily activities. This is usually used to diagnose what is causing palpitations/syncope (passing out).   NO CHANGE WITH MEDICATION AT PRESENT TIME      Your physician recommends that you  schedule a follow-up appointment in 2 [employer].    Studies Ordered:   Orders Placed This Encounter  Procedures  . CARDIAC EVENT MONITOR  . EKG 12-Lead      Glenetta Hew, M.D., M.S. Interventional Cardiologist   Pager # [phone] Phone # [phone] [address] [place], [place] [address]

## 2017-03-31 NOTE — Telephone Encounter (Signed)
Pt notified that it is very important to "push the button" to notify of AFIB sx, verbalizes understanding

## 2017-04-01 ENCOUNTER — Encounter: Payer: Self-pay | Admitting: Cardiology

## 2017-05-20 ENCOUNTER — Ambulatory Visit: Payer: Federal, State, Local not specified - PPO | Admitting: Family Medicine

## 2017-05-20 ENCOUNTER — Encounter: Payer: Self-pay | Admitting: Family Medicine

## 2017-05-20 VITALS — BP 122/70 | HR 70 | Temp 98.4°F | Resp 16 | Ht 67.0 in | Wt 184.4 lb

## 2017-05-20 DIAGNOSIS — Z86010 Personal history of colonic polyps: Secondary | ICD-10-CM

## 2017-05-20 DIAGNOSIS — H9193 Unspecified hearing loss, bilateral: Secondary | ICD-10-CM | POA: Diagnosis not present

## 2017-05-20 DIAGNOSIS — I480 Paroxysmal atrial fibrillation: Secondary | ICD-10-CM | POA: Diagnosis not present

## 2017-05-20 DIAGNOSIS — Z23 Encounter for immunization: Secondary | ICD-10-CM | POA: Diagnosis not present

## 2017-05-20 DIAGNOSIS — Z96659 Presence of unspecified artificial knee joint: Secondary | ICD-10-CM

## 2017-05-20 DIAGNOSIS — Z0000 Encounter for general adult medical examination without abnormal findings: Secondary | ICD-10-CM | POA: Diagnosis not present

## 2017-05-20 LAB — CBC WITH DIFFERENTIAL/PLATELET
BASOS ABS: 57 {cells}/uL (ref 0–200)
Basophils Relative: 0.6 %
EOS ABS: 361 {cells}/uL (ref 15–500)
EOS PCT: 3.8 %
HEMATOCRIT: 40.6 % (ref 38.5–50.0)
Hemoglobin: 14.2 g/dL (ref 13.2–17.1)
LYMPHS ABS: 1805 {cells}/uL (ref 850–3900)
MCH: 32 pg (ref 27.0–33.0)
MCHC: 35 g/dL (ref 32.0–36.0)
MCV: 91.4 fL (ref 80.0–100.0)
MPV: 11.7 fL (ref 7.5–12.5)
Monocytes Relative: 9.2 %
NEUTROS PCT: 67.4 %
Neutro Abs: 6403 cells/uL (ref 1500–7800)
Platelets: 238 10*3/uL (ref 140–400)
RBC: 4.44 10*6/uL (ref 4.20–5.80)
RDW: 12.1 % (ref 11.0–15.0)
Total Lymphocyte: 19 %
WBC mixed population: 874 cells/uL (ref 200–950)
WBC: 9.5 10*3/uL (ref 3.8–10.8)

## 2017-05-20 LAB — COMPREHENSIVE METABOLIC PANEL
AG Ratio: 1.6 (calc) (ref 1.0–2.5)
ALKALINE PHOSPHATASE (APISO): 70 U/L (ref 40–115)
ALT: 12 U/L (ref 9–46)
AST: 15 U/L (ref 10–35)
Albumin: 4.4 g/dL (ref 3.6–5.1)
BILIRUBIN TOTAL: 0.5 mg/dL (ref 0.2–1.2)
BUN: 10 mg/dL (ref 7–25)
CALCIUM: 9.3 mg/dL (ref 8.6–10.3)
CO2: 28 mmol/L (ref 20–32)
Chloride: 102 mmol/L (ref 98–110)
Creat: 0.75 mg/dL (ref 0.70–1.25)
Globulin: 2.8 g/dL (calc) (ref 1.9–3.7)
Glucose, Bld: 88 mg/dL (ref 65–99)
Potassium: 4.8 mmol/L (ref 3.5–5.3)
Sodium: 138 mmol/L (ref 135–146)
TOTAL PROTEIN: 7.2 g/dL (ref 6.1–8.1)

## 2017-05-20 LAB — POCT URINALYSIS DIP (PROADVANTAGE DEVICE)
BILIRUBIN UA: NEGATIVE mg/dL
Bilirubin, UA: NEGATIVE
Blood, UA: NEGATIVE
Glucose, UA: NEGATIVE mg/dL
Leukocytes, UA: NEGATIVE
Nitrite, UA: NEGATIVE
PROTEIN UA: NEGATIVE mg/dL
Specific Gravity, Urine: 1.01
Urobilinogen, Ur: NEGATIVE
pH, UA: 7 (ref 5.0–8.0)

## 2017-05-20 LAB — LIPID PANEL
CHOLESTEROL: 163 mg/dL (ref <200)
HDL: 72 mg/dL (ref >40)
LDL CHOLESTEROL (CALC): 75 mg/dL
Non-HDL Cholesterol (Calc): 91 mg/dL (calc) (ref <130)
TRIGLYCERIDES: 82 mg/dL (ref <150)
Total CHOL/HDL Ratio: 2.3 (calc) (ref <5.0)

## 2017-05-20 NOTE — Progress Notes (Signed)
Subjective:    Patient ID: Matthew Mason, male    DOB: January 07, 1951, 66 y.o.   MRN: [id number]  HPI He is here for complete examination.  He does have a previous history of PAF and apparently did have evidence of atrial fib with his last visit to the cardiologist.  Apparently a 30-day monitor is being done.  He does note that alcohol consumption does affect his heart rate but at this point has not stopped completely.  Presently he apparently is not taking diltiazem.  He thinks it different forms of alcohol have a different effect.  He has had bilateral partial knee replacement and is doing well with that.  Presently he does use hearing aids.  He does have a history of colonic polyps and does need a follow-up appointment concerning that.   Review of Systems  All other systems reviewed and are negative.      Objective:   Physical Exam BP 122/70   Pulse 70   Temp 98.4 F (36.9 C)   Resp 16   Ht 5\' 7"  (1.702 m)   Wt 184 lb 6.4 oz (83.6 kg)   SpO2 97%   BMI 28.88 kg/m   General Appearance:    Alert, cooperative, no distress, appears stated age  Head:    Normocephalic, without obvious abnormality, atraumatic  Eyes:    PERRL, conjunctiva/corneas clear, EOM's intact, fundi    benign  Ears:    Normal TM's and external ear canals  Nose:   Nares normal, mucosa normal, no drainage or sinus   tenderness  Throat:   Lips, mucosa, and tongue normal; teeth and gums normal  Neck:   Supple, no lymphadenopathy;  thyroid:  no   enlargement/tenderness/nodules; no carotid   bruit or JVD     Lungs:     Clear to auscultation bilaterally without wheezes, rales or     ronchi; respirations unlabored      Heart:    Regular rate and rhythm, S1 and S2 normal, no murmur, rub   or gallop     Abdomen:     Soft, non-tender, nondistended, normoactive bowel sounds,    no masses, no hepatosplenomegaly  Genitalia:    Normal male external genitalia without lesions.  Testicles without masses.  No inguinal  hernias.  Rectal:   Deferred  Extremities:   No clubbing, cyanosis or edema  Pulses:   2+ and symmetric all extremities  Skin:   Skin color, texture, turgor normal, no rashes or lesions  Lymph nodes:   Cervical, supraclavicular, and axillary nodes normal  Neurologic:   CNII-XII intact, normal strength, sensation and gait; reflexes 2+ and symmetric throughout          Psych:   Normal mood, affect, hygiene and grooming.          Assessment & Plan:  Routine general medical examination at a health care facility - Plan: POCT Urinalysis DIP (Proadvantage Device), CBC with Differential/Platelet, Comprehensive metabolic panel, Lipid panel  PAF (paroxysmal atrial fibrillation) (HCC) - Plan: CBC with Differential/Platelet, Comprehensive metabolic panel, Lipid panel  Personal history of colonic polyps - Plan: Ambulatory referral to Gastroenterology, CBC with Differential/Platelet, Comprehensive metabolic panel  History of partial knee replacement  Bilateral hearing loss, unspecified hearing loss type  Need for vaccination against Streptococcus pneumoniae - Plan: Pneumococcal conjugate vaccine 13-valent  Need for shingles vaccine - Plan: Varicella-zoster vaccine IM (Shingrix)  I strongly encouraged him to abstain from all alcohol.  I explained that it does  not matter what form the alcohol takes, he can have this effect on his heart.

## 2017-06-01 ENCOUNTER — Ambulatory Visit: Payer: Federal, State, Local not specified - PPO | Admitting: Cardiology

## 2017-06-08 ENCOUNTER — Encounter: Payer: Self-pay | Admitting: Cardiology

## 2017-06-08 ENCOUNTER — Ambulatory Visit: Payer: Federal, State, Local not specified - PPO | Admitting: Cardiology

## 2017-06-08 VITALS — BP 120/78 | HR 74 | Ht 68.0 in | Wt 197.0 lb

## 2017-06-08 DIAGNOSIS — Z86010 Personal history of colonic polyps: Secondary | ICD-10-CM | POA: Diagnosis not present

## 2017-06-08 DIAGNOSIS — I480 Paroxysmal atrial fibrillation: Secondary | ICD-10-CM | POA: Diagnosis not present

## 2017-06-08 MED ORDER — APIXABAN 5 MG PO TABS: 5.0000 mg | ORAL_TABLET | Freq: Two times a day (BID) | ORAL | 11 refills | Status: DC

## 2017-06-08 NOTE — Progress Notes (Addendum)
PCP: Denita Lung, MD  Clinic Note: Chief Complaint  Patient presents with  . Follow-up    2 months -follow-up monitor  . Atrial Fibrillation    HPI: Matthew Mason is a 66 y.o. male with a PMH below who presents today for follow-up with history of PAF. No longer taking ELIQUIS. Had not had any recurrences since 2012 and 2015.  We stopped his diltiazem because of fatigue and lack of recurrence. --Started noticing it back in the fall 2018.  Matthew Mason was last seen in October 2018 for callback evaluation in order to discuss medications.  He was actually noted to be in atrial fibrillation at that time.  We ordered a monitor to determine his A. fib burden.  Recent Hospitalizations: none  Studies Personally Reviewed - (if available, images/films reviewed: From Epic Chart or Care Everywhere)  Event Monitor -  Monitor from March 30, 2017-April 28, 2017: 27% atrial fib/atrial flutter  Heart rate ranged from 54-176 BPM with an average of 81 bpm  Baseline rhythm was sinus rhythm and sinus bradycardia. Several episodes that appeared to be either atrial flutter with variable block versus coarse atrial fibrillation burden identified.  Apparently conducted beats were noted  The patient only noted 1 of 11 occurrences of atrial fib/flutter  Interval History: Matthew Mason returns today to review the results of his monitor.  He was in atrial fibrillation when I last saw him did notice at least 1 of the episodes that the monitor identified, but was not aware of the other spells.  He otherwise has been doing very well.  He is quite active playing golf enjoying retirement.  He has not had any other cardiac symptoms besides the occasional fast heartbeat spells.  No chest tightness or pressure with rest or exertion.  No prolonged fast heart rate spells.  No lightheadedness, dizziness or wooziness.  No syncope/near syncope or TIA/amaurosis fugax.  No PND, orthopnea or edema. No claudication.  The  one thing he does note, is that he is gained quite a bit of weight over the last several months.  He is not really sure why, but has probably been exercising less and eating more.  ROS: A comprehensive was performed. Review of Systems  Constitutional: Negative for malaise/fatigue.  HENT: Negative for nosebleeds.   Cardiovascular: Positive for palpitations.  Gastrointestinal: Negative for blood in stool and melena.  Genitourinary: Negative for hematuria.  Musculoskeletal: Negative for falls and joint pain.  Endo/Heme/Allergies: Does not bruise/bleed easily.  Psychiatric/Behavioral: Negative for depression and memory loss. The patient is not nervous/anxious and does not have insomnia.   All other systems reviewed and are negative.  I have reviewed and (if needed) personally updated the patient's problem list, medications, allergies, past medical and surgical history, social and family history.   Past Medical History:  Diagnosis Date  . Arthritis    knee  . Dysrhythmia   . Hemorrhoids   . HOH (hard of hearing)    Bilateral hearing aids  . Hypertension    Dr. Ellyn Hack took patient off BP meds  . Paroxysmal atrial fibrillation ([place]) 2012   Several short episodes on Event Monitor aftrer initial hospitalization. -- On PRN Diltiazem & ASA  . Personal history of colonic polyps     Past Surgical History:  Procedure Laterality Date  . COLONOSCOPY  01/07   Dr. Olevia Perches  . KNEE ARTHROSCOPY Right   . NM MYOVIEW LTD  June 2012   Treadmill: Walks 11.5 minutes, 12 METs,  no ischemia or infarction  . PARTIAL KNEE ARTHROPLASTY Right 09/01/2014   Procedure: RIGHT  UNICOMPARTMENTAL KNEE;  Surgeon: Elsie Saas, MD;  Location: [facility];  Service: Orthopedics;  Laterality: Right;  . TRANSTHORACIC ECHOCARDIOGRAM  June 2012    Normal LV size and function, EF>55%,Gr 1 DD, no regional WMA, Trace MR.    Current Meds  Medication Sig  . [DISCONTINUED] aspirin EC 325 MG tablet 1 tab a day for the next 30  days to prevent blood clots    No Known Allergies  Social History   Socioeconomic History  . Marital status: Single    Spouse name: None  . Number of children: None  . Years of education: None  . Highest education level: None  Social Needs  . Financial resource strain: None  . Food insecurity - worry: None  . Food insecurity - inability: None  . Transportation needs - medical: None  . Transportation needs - non-medical: None  Occupational History  . None  Tobacco Use  . Smoking status: Former Smoker    Last attempt to quit: 06/23/1998    Years since quitting: 18.9  . Smokeless tobacco: Never Used  Substance and Sexual Activity  . Alcohol use: Yes    Alcohol/week: 15.0 oz    Types: 25 Cans of beer per week    Comment: 3-4 times a week  . Drug use: No  . Sexual activity: Not Currently  Other Topics Concern  . None  Social History Narrative   Divorced father of 2, grandfather and 3. Notably reduced social stressors since divorce.   Exercises routinely, walks 4-5 times a week. He also plays tennis several days a week. Occasionally smokes cigars and has a social beer. Otherwise nothing significant.    family history includes Cancer (age of onset: 73) in his brother; Heart disease in his mother; Heart disease (age of onset: 38) in his father.  Wt Readings from Last 3 Encounters:  06/08/17 197 lb (89.4 kg)  05/20/17 184 lb 6.4 oz (83.6 kg)  03/30/17 180 lb 9.6 oz (81.9 kg)    PHYSICAL EXAM BP 120/78   Pulse 74   Ht 5\' 8"  (1.727 m)   Wt 197 lb (89.4 kg)   BMI 29.95 kg/m  Physical Exam  Constitutional: He is oriented to person, place, and time. He appears well-developed and well-nourished. No distress.  HENT:  Head: Normocephalic and atraumatic.  Neck: Normal range of motion. No JVD present.  Cardiovascular: Normal rate, regular rhythm and intact distal pulses. PMI is not displaced. Exam reveals no gallop and no friction rub.  No murmur heard. Pulmonary/Chest:  Effort normal and breath sounds normal. No respiratory distress. He has no wheezes. He has no rales.  Abdominal: Soft. Bowel sounds are normal. He exhibits no distension. There is no tenderness. There is no rebound.  Musculoskeletal: Normal range of motion. He exhibits no edema.  Neurological: He is alert and oriented to person, place, and time.  Psychiatric: He has a normal mood and affect. His behavior is normal. Judgment and thought content normal.  Nursing note and vitals reviewed.     Adult ECG Report Not checked   Other studies Reviewed: Additional studies/ records that were reviewed today include:  Recent Labs:  None available.    ASSESSMENT / PLAN: Problem List Items Addressed This Visit    PAF (paroxysmal atrial fibrillation) (HCC) - Primary (Chronic)    Pretty significant A. fib burden noted on monitor.  The more concerning issue  is that he only noticed 1 out of 11 episodes. The fact that he is asymptomatic and rate controlled would argue against attempting rhythm control or even potential ablation.  The episodes do not seem to be lasting long enough for now I would probably try to avoid medications and potentially consider as needed pill in the pocket medications.  Based on the amount of A. fib burden, and him now being over 5 years old, he is now at higher risk. This patients CHA2DS2-VASc Score and unadjusted Ischemic Stroke Rate (% per year) is equal to 2.2 % stroke rate/year from a score of 2  Above score calculated as 1 point each if present [CHF, HTN, DM, Vascular=MI/PAD/Aortic Plaque, Age if 26-74, or Male]; Above score calculated as 2 points each if present [Age > 75, or Stroke/TIA/TE]  Perhaps more because I am reluctant to place him on rate or rhythm control agents in the the absence of symptoms, I would like to protect him against stroke, and allow for the possibility of as needed cardioversion either electrical or chemical.  Therefore, we will start him back on  Eliquis.       Relevant Medications   apixaban (ELIQUIS) 5 MG TABS tablet   Personal history of colonic polyps    He is due to have colonoscopy soon.  It would be fine for him to hold Eliquis 2 days prior to his colonoscopy, and restart the day following. Given the concern for possible GI bleed from polyps, Eliquis is a better option than other DOAC medications.         Current medicines are reviewed at length with the patient today. (+/- concerns) n/a The following changes have been made: n/a  Patient Instructions  MEDICATION INSTRUCTION  --- STOP ASPIRIN    ---START ELIQUIS 5 MG ONE TABLET TWICE  DAY     Your physician wants you to follow-up in [place]. You will receive a reminder letter in the mail two months in advance. If you don't receive a letter, please call our office to schedule the follow-up appointment.    If you need a refill on your cardiac medications before your next appointment, please call your pharmacy.    Studies Ordered:   No orders of the defined types were placed in this encounter.     Glenetta Hew, M.D., M.S. Interventional Cardiologist   Pager # [phone] Phone # [phone] [address] [place], [place] [address]

## 2017-06-08 NOTE — Patient Instructions (Signed)
MEDICATION INSTRUCTION  --- STOP ASPIRIN    ---START ELIQUIS 5 MG ONE TABLET TWICE  DAY     Your physician wants you to follow-up in [place]. You will receive a reminder letter in the mail two months in advance. If you don't receive a letter, please call our office to schedule the follow-up appointment.    If you need a refill on your cardiac medications before your next appointment, please call your pharmacy.

## 2017-06-10 ENCOUNTER — Encounter: Payer: Self-pay | Admitting: Cardiology

## 2017-06-11 NOTE — Assessment & Plan Note (Signed)
Pretty significant A. fib burden noted on monitor.  The more concerning issue is that he only noticed 1 out of 11 episodes. The fact that he is asymptomatic and rate controlled would argue against attempting rhythm control or even potential ablation.  The episodes do not seem to be lasting long enough for now I would probably try to avoid medications and potentially consider as needed pill in the pocket medications.  Based on the amount of A. fib burden, and him now being over 66 years old, he is now at higher risk. This patients CHA2DS2-VASc Score and unadjusted Ischemic Stroke Rate (% per year) is equal to 2.2 % stroke rate/year from a score of 2  Above score calculated as 1 point each if present [CHF, HTN, DM, Vascular=MI/PAD/Aortic Plaque, Age if 73-74, or Male]; Above score calculated as 2 points each if present [Age > 75, or Stroke/TIA/TE]  Perhaps more because I am reluctant to place him on rate or rhythm control agents in the the absence of symptoms, I would like to protect him against stroke, and allow for the possibility of as needed cardioversion either electrical or chemical.  Therefore, we will start him back on Eliquis.

## 2017-06-11 NOTE — Assessment & Plan Note (Addendum)
He is due to have colonoscopy soon.  It would be fine for him to hold Eliquis 2 days prior to his colonoscopy, and restart the day following. Given the concern for possible GI bleed from polyps, Eliquis is a better option than other DOAC medications.

## 2017-06-19 ENCOUNTER — Encounter: Payer: Self-pay | Admitting: Family Medicine

## 2017-07-21 ENCOUNTER — Other Ambulatory Visit (INDEPENDENT_AMBULATORY_CARE_PROVIDER_SITE_OTHER): Payer: Federal, State, Local not specified - PPO

## 2017-07-21 DIAGNOSIS — Z23 Encounter for immunization: Secondary | ICD-10-CM

## 2018-05-31 ENCOUNTER — Other Ambulatory Visit: Payer: Self-pay | Admitting: Cardiology

## 2018-06-28 ENCOUNTER — Other Ambulatory Visit: Payer: Self-pay | Admitting: Cardiology

## 2018-07-26 ENCOUNTER — Other Ambulatory Visit: Payer: Self-pay | Admitting: Cardiology

## 2018-07-26 DIAGNOSIS — I480 Paroxysmal atrial fibrillation: Secondary | ICD-10-CM

## 2018-07-26 NOTE — Telephone Encounter (Signed)
Called pt left msg stating that we need recent lab to refill eliquis labs ordered

## 2018-08-10 ENCOUNTER — Other Ambulatory Visit: Payer: Self-pay | Admitting: Cardiology

## 2018-08-17 ENCOUNTER — Other Ambulatory Visit: Payer: Federal, State, Local not specified - PPO

## 2018-08-17 ENCOUNTER — Encounter: Payer: Self-pay | Admitting: Cardiology

## 2018-08-17 ENCOUNTER — Other Ambulatory Visit: Payer: Self-pay | Admitting: Cardiology

## 2018-08-17 ENCOUNTER — Ambulatory Visit: Payer: Federal, State, Local not specified - PPO | Admitting: Cardiology

## 2018-08-17 VITALS — BP 126/80 | HR 73 | Ht 68.0 in | Wt 187.0 lb

## 2018-08-17 DIAGNOSIS — I480 Paroxysmal atrial fibrillation: Secondary | ICD-10-CM

## 2018-08-17 DIAGNOSIS — Z7901 Long term (current) use of anticoagulants: Secondary | ICD-10-CM

## 2018-08-17 MED ORDER — FLECAINIDE ACETATE 100 MG PO TABS: ORAL_TABLET | ORAL | 0 refills | Status: DC

## 2018-08-17 MED ORDER — METOPROLOL TARTRATE 25 MG PO TABS: ORAL_TABLET | ORAL | 0 refills | Status: DC

## 2018-08-17 MED ORDER — APIXABAN 5 MG PO TABS: 5.0000 mg | ORAL_TABLET | Freq: Two times a day (BID) | ORAL | 6 refills | Status: DC

## 2018-08-17 NOTE — Patient Instructions (Signed)
Medication Instructions:  START Flecainide 100 mg and Metoprolol Tartrate 25  If you are in atrial fibrillation for more than an hour, take 1 Tablet of Flecainide and 1 tablet of metoprolol.  If you are still in atrial fibrillation 6-8 hours after that, take 1 tablet of flecainide and 1 tablet of metoprolol again. If you go back into normal rhythm, take 1/2 tablet of flecainide and 1/2 tablet of metoprolol then following day. Then Stop.  If you need a refill on your cardiac medications before your next appointment, please call your pharmacy.   Follow-Up: At [facility], you and your health needs are our priority.  As part of our continuing mission to provide you with exceptional heart care, we have created designated Provider Care Teams.  These Care Teams include your primary Cardiologist (physician) and Advanced Practice Providers (APPs -  Physician Assistants and Nurse Practitioners) who all work together to provide you with the care you need, when you need it. You will need a follow up appointment in 1 year.  Please call our office 2 months in advance to schedule this appointment.  You may see Glenetta Hew, MD or one of the following Advanced Practice Providers on your designated Care Team:   Rosaria Ferries, PA-C . Jory Sims, DNP, ANP  Any Other Special Instructions Will Be Listed Below (If Applicable). OK to hold Eliquis for 2 days prior to colonoscopy. OK to hold for 3 days prior to spinal/neural procedures.

## 2018-08-17 NOTE — Progress Notes (Addendum)
PCP: Denita Lung, MD  Clinic Note: Chief Complaint  Patient presents with  . Follow-up    Annual  . Atrial Fibrillation    HPI: Matthew Mason is a 68 y.o. male with a history of paroxysmal atrial fibrillation who is here for annual follow-up.    He is now back on ELIQUIS after being off of it for some time.. Had not had any recurrences since 2012 and 2015.   We stopped his diltiazem because of fatigue and lack of recurrence. -- He started noticing recurrent symptoms of A. fib back in the fall of 2018.  Event monitor showed 11 brief episodes of atrial fib/flutter of which he only noted symptoms with 1.  Matthew Mason was last seen in December 2018 for follow-up of monitor showing intermittent atrial fib/flutter.  He was only aware of one episode.  Given the amount of recurrent symptoms, we put him back on Eliquis.  Recent Hospitalizations: none  Studies Personally Reviewed - (if available, images/films reviewed: From Epic Chart or Care Everywhere)  None  Interval History: Matthew Mason returns actually doing fairly well.  He says every now and then he has a short little episode of what he thinks may be A. fib.  He does not really notice anything lasting a long period time and has not really had that many symptoms to be worried about.  He says sometimes the heart rate goes fast he feels kind of funny but these episodes do not last very long. He has not had any chest pain or pressure with rest or exertion.  No heart failure symptoms of PND, orthopnea or edema.  No syncope/near syncope or TIA/amaurosis fugax.  No major bleeding issues since being on Eliquis, just some bruising. He is due to have a colonoscopy soon and asked some questions about timing of stopping/holding Eliquis for the procedure.  No claudication. He has been active again and is lost the 10 pounds back today again.  Adjusted his diet as well.   Review of Systems  Constitutional: Negative for malaise/fatigue.    HENT: Negative for nosebleeds.   Respiratory: Negative for shortness of breath and wheezing.   Cardiovascular: Positive for palpitations.  Gastrointestinal: Negative for blood in stool and melena.  Genitourinary: Negative for hematuria.  Musculoskeletal: Negative for falls and joint pain.  Endo/Heme/Allergies: Bruises/bleeds easily.  Psychiatric/Behavioral: Negative for depression and memory loss. The patient is not nervous/anxious and does not have insomnia.   All other systems reviewed and are negative.  I have reviewed and (if needed) personally updated the patient's problem list, medications, allergies, past medical and surgical history, social and family history.   Past Medical History:  Diagnosis Date  . Arthritis    knee  . Dysrhythmia   . Hemorrhoids   . HOH (hard of hearing)    Bilateral hearing aids  . Hypertension    Dr. Ellyn Hack took patient off BP meds  . Paroxysmal atrial fibrillation ([place]) 2012   Several short episodes on Event Monitor aftrer initial hospitalization. -- On PRN Diltiazem & ASA  . Personal history of colonic polyps     Past Surgical History:  Procedure Laterality Date  . COLONOSCOPY  01/07   Dr. Olevia Perches  . KNEE ARTHROSCOPY Right   . NM MYOVIEW LTD  June 2012   Treadmill: Walks 11.5 minutes, 12 METs, no ischemia or infarction  . PARTIAL KNEE ARTHROPLASTY Right 09/01/2014   Procedure: RIGHT  UNICOMPARTMENTAL KNEE;  Surgeon: Elsie Saas, MD;  Location:  [facility];  Service: Orthopedics;  Laterality: Right;  . TRANSTHORACIC ECHOCARDIOGRAM  June 2012    Normal LV size and function, EF>55%,Gr 1 DD, no regional WMA, Trace MR.    Current Meds  Medication Sig  . apixaban (ELIQUIS) 5 MG TABS tablet Take 1 tablet (5 mg total) by mouth 2 (two) times daily.  . [DISCONTINUED] ELIQUIS 5 MG TABS tablet TAKE 1 TABLET(5 MG) BY MOUTH TWICE DAILY    No Known Allergies  Social History   Tobacco Use  . Smoking status: Former Smoker    Last attempt to quit:  06/23/1998    Years since quitting: 20.1  . Smokeless tobacco: Never Used  Substance Use Topics  . Alcohol use: Yes    Alcohol/week: 25.0 standard drinks    Types: 25 Cans of beer per week    Comment: 3-4 times a week  . Drug use: No   Social History   Social History Narrative   Divorced father of 2, grandfather and 3. Notably reduced social stressors since divorce.   Exercises routinely, walks 4-5 times a week. He also plays tennis several days a week. Occasionally smokes cigars and has a social beer. Otherwise nothing significant.     family history includes Cancer (age of onset: 45) in his brother; Heart disease in his mother; Heart disease (age of onset: 69) in his father.  Wt Readings from Last 3 Encounters:  08/17/18 187 lb (84.8 kg)  06/08/17 197 lb (89.4 kg)  05/20/17 184 lb 6.4 oz (83.6 kg)    PHYSICAL EXAM BP 126/80 (BP Location: Left Arm, Patient Position: Sitting, Cuff Size: Normal)   Pulse 73   Ht 5\' 8"  (1.727 m)   Wt 187 lb (84.8 kg)   BMI 28.43 kg/m  Physical Exam  Constitutional: He is oriented to person, place, and time. He appears well-developed and well-nourished. No distress.  HENT:  Head: Normocephalic and atraumatic.  Neck: Normal range of motion. Neck supple. No JVD present.  Cardiovascular: Normal rate, regular rhythm, normal heart sounds and intact distal pulses.  Occasional extrasystoles are present. PMI is not displaced. Exam reveals no gallop and no friction rub.  No murmur heard. Pulmonary/Chest: Effort normal and breath sounds normal. No respiratory distress. He has no wheezes. He has no rales.  Musculoskeletal: Normal range of motion.        General: No edema.  Neurological: He is alert and oriented to person, place, and time.  Psychiatric: He has a normal mood and affect. His behavior is normal. Judgment and thought content normal.  Nursing note and vitals reviewed.     Adult ECG Report Normal sinus rhythm, rate 73 bpm.  Cannot exclude  left atrial enlargement.  Otherwise normal axis, intervals durations.   Other studies Reviewed: Additional studies/ records that were reviewed today include:  Recent Labs:  None available since last visit..    ASSESSMENT / PLAN: Problem List Items Addressed This Visit    Chronic anticoagulation    He is on anticoagulation with Eliquis for his A. fib.  This is a medication that can be held 48 hours prior to any GI or other procedure, however if it is a neuro procedure such as myelogram or lumbar puncture etc., would hold 72 hours prior to procedure.  Restart day 1 post procedure.      PAF (paroxysmal atrial fibrillation): CHA2DS2Vasc = 2 -> Eliquis - Primary (Chronic)    Doing well, but does have some asymptomatic A. fib burden.  He  is not currently on any rate control agents and doing relatively well.  He is only on Eliquis for anticoagulation.  Since he does have some symptoms, I think we can try a trial of pill in the pocket flecainide and metoprolol.  Flecainide 100 mg and Metoprolol Tartrate 25  Patient instructions: If you are in atrial fibrillation for more than an hour, take 1 Tablet of Flecainide and 1 tablet of metoprolol.  If you are still in atrial fibrillation 6-8 hours after that, take 1 tablet of flecainide and 1 tablet of metoprolol again. If you go back into normal rhythm, take 1/2 tablet of flecainide and 1/2 tablet of metoprolol then following day. Then Stop.      Relevant Medications   apixaban (ELIQUIS) 5 MG TABS tablet   Other Relevant Orders   EKG 12-Lead (Completed)      Current medicines are reviewed at length with the patient today. (+/- concerns) n/a The following changes have been made: n/a  Patient Instructions  Medication Instructions:  START Flecainide 100 mg and Metoprolol Tartrate 25  If you are in atrial fibrillation for more than an hour, take 1 Tablet of Flecainide and 1 tablet of metoprolol.  If you are still in atrial fibrillation  6-8 hours after that, take 1 tablet of flecainide and 1 tablet of metoprolol again. If you go back into normal rhythm, take 1/2 tablet of flecainide and 1/2 tablet of metoprolol then following day. Then Stop.  If you need a refill on your cardiac medications before your next appointment, please call your pharmacy.   Follow-Up: At [facility], you and your health needs are our priority.  As part of our continuing mission to provide you with exceptional heart care, we have created designated Provider Care Teams.  These Care Teams include your primary Cardiologist (physician) and Advanced Practice Providers (APPs -  Physician Assistants and Nurse Practitioners) who all work together to provide you with the care you need, when you need it. You will need a follow up appointment in 1 year.  Please call our office 2 months in advance to schedule this appointment.  You may see Glenetta Hew, MD or one of the following Advanced Practice Providers on your designated Care Team:   Rosaria Ferries, PA-C . Jory Sims, DNP, ANP  Any Other Special Instructions Will Be Listed Below (If Applicable). OK to hold Eliquis for 2 days prior to colonoscopy. OK to hold for 3 days prior to spinal/neural procedures.     Studies Ordered:   Orders Placed This Encounter  Procedures  . EKG 12-Lead      Glenetta Hew, M.D., M.S. Interventional Cardiologist   Pager # [phone] Phone # [phone] [address] [place], [place] [address]

## 2018-08-18 LAB — COMPREHENSIVE METABOLIC PANEL
A/G RATIO: 2.3 — AB (ref 1.2–2.2)
ALT: 15 IU/L (ref 0–44)
AST: 15 IU/L (ref 0–40)
Albumin: 4.8 g/dL (ref 3.8–4.8)
Alkaline Phosphatase: 58 IU/L (ref 39–117)
BUN/Creatinine Ratio: 23 (ref 10–24)
BUN: 20 mg/dL (ref 8–27)
Bilirubin Total: 0.6 mg/dL (ref 0.0–1.2)
CALCIUM: 9.5 mg/dL (ref 8.6–10.2)
CHLORIDE: 103 mmol/L (ref 96–106)
CO2: 23 mmol/L (ref 20–29)
Creatinine, Ser: 0.86 mg/dL (ref 0.76–1.27)
GFR calc Af Amer: 104 mL/min/{1.73_m2} (ref >59)
GFR calc non Af Amer: 90 mL/min/{1.73_m2} (ref >59)
Globulin, Total: 2.1 g/dL (ref 1.5–4.5)
Glucose: 100 mg/dL — ABNORMAL HIGH (ref 65–99)
Potassium: 4.5 mmol/L (ref 3.5–5.2)
Sodium: 143 mmol/L (ref 134–144)
Total Protein: 6.9 g/dL (ref 6.0–8.5)

## 2018-08-20 ENCOUNTER — Telehealth: Payer: Self-pay | Admitting: Cardiology

## 2018-08-20 NOTE — Telephone Encounter (Signed)
Encounter not needed

## 2018-08-22 ENCOUNTER — Encounter: Payer: Self-pay | Admitting: Cardiology

## 2018-08-22 DIAGNOSIS — Z7901 Long term (current) use of anticoagulants: Secondary | ICD-10-CM | POA: Insufficient documentation

## 2018-08-22 NOTE — Addendum Note (Signed)
Addended by: Leonie Man on: 08/22/2018 10:13 PM   Modules accepted: Level of Service

## 2018-08-22 NOTE — Assessment & Plan Note (Addendum)
Doing well, but does have some asymptomatic A. fib burden.  He is not currently on any rate control agents and doing relatively well.  He is only on Eliquis for anticoagulation.  Since he does have some symptoms, I think we can try a trial of pill in the pocket flecainide and metoprolol.  Flecainide 100 mg and Metoprolol Tartrate 25  Patient instructions: If you are in atrial fibrillation for more than an hour, take 1 Tablet of Flecainide and 1 tablet of metoprolol.  If you are still in atrial fibrillation 6-8 hours after that, take 1 tablet of flecainide and 1 tablet of metoprolol again. If you go back into normal rhythm, take 1/2 tablet of flecainide and 1/2 tablet of metoprolol then following day. Then Stop.

## 2018-08-22 NOTE — Assessment & Plan Note (Signed)
He is on anticoagulation with Eliquis for his A. fib.  This is a medication that can be held 48 hours prior to any GI or other procedure, however if it is a neuro procedure such as myelogram or lumbar puncture etc., would hold 72 hours prior to procedure.  Restart day 1 post procedure.

## 2018-08-27 ENCOUNTER — Other Ambulatory Visit: Payer: Self-pay | Admitting: Cardiology

## 2019-03-21 ENCOUNTER — Other Ambulatory Visit: Payer: Self-pay | Admitting: Cardiology

## 2019-09-16 ENCOUNTER — Encounter (HOSPITAL_COMMUNITY): Payer: Self-pay

## 2019-09-17 NOTE — Historical Transplant Communications (Signed)
Reginald Lucas, Reginald Lucas ([id number])  Transplant Communications  ____________________________________________________________________________________    Date: 04/20/2014  Entry By: Girard Cooter [id number]    Dorise Bullion - Reginald Lucas is to be transferred back to the [place] but they will not accept him unless his IABP is taken out. An attempt at the weaning process is being initiated. Reginald Lucas just wants to go home.  ____________________________________________________________________________________    Date: 04/13/2014  Entry By: Girard Cooter [id number]    Care Planning  Reginald Lucas, Reginald Lucas - Reginald Lucas is a 69 year old gentleman who was discussed last week. He is ABO: A with a BMI of 26. He was referred by the [facility] and has had multiple admissions for heart failure. He has several points of concern. He has significant anxiety and depression, liver disease, and multiple bladder polyps. He has ischemic cardiomyopathy and underwent two stents in 1992, another PCI in 2011, and has had a primary prevention ICD placed. He had an inappropriate shock a few months ago for a-fib. He had an IABP inserted this past Friday and is on dobutamine 27mcg, as well as a Lasix drip. His end diastolic dimension is 2.1HY, his EF is 20-25% and his apex is akinetic. He has severe TR, mild-moderate PR, and mild RV dysfunction. A right heart cath performed when he was on dobutamine 62mcg and milrinone 0.176mcg gave an RA pressure of 5, PCW 22, TPG 11, CO 4.7, CI 2.3 and MAP 28. His T-bili is 2.2-2.4, albumin 3.6, and ammonia 52, so there is clearly some hepatic dysfunction. His MELD score is 16 and Child score 6-8. His liver biopsy did not show any cirrhosis. He does not have diabetes. He does have some ascites, but not too bad. He also has some pulmonary restriction. His creatinine has been running between 1.5-1.8 and the Urology team would like to do a cystoscopy to investigate his polyps but can't do  so currently on account of the IABP. His bilateral ICAs show 16-49% diameter reduction. Psychiatry has seen him and they feel that his depression and anxiety are largely due to his current circumstances, although they do appear to be worse when his husband is not with him. He has a tremor that could be potentially due to metabolic issues, but there is concern that there is a Parkkinsonian aspect. He scored a 26 on a mini-mental status test so there is also worry that he may be developing dementia.  According to providers at the [facility] his neurological status has never been completely within normal range. He cannot be transplanted here due to [place] restrictions, so it would probably be best to tune him up for transfer back to the [place], and then if they want to send him to [facility] for advanced therapy consideration, they can.  ____________________________________________________________________________________    Date: 04/06/2014  Entry By: Girard Cooter [id number]    Care Planning  Reginald Lucas, Reginald Lucas - Reginald Lucas is a 69 year old gentleman who was discussed last week. He is ABO: A with a BMI of 26. He was referred by the [facility] and has had multiple admissions for heart failure. He has several points of concern. He has significant anxiety and depression, liver disease, and multiple bladder polyps. He has ischemic cardiomyopathy and underwent two stents in 1992, another PCI in 2011, and has had a primary prevention ICD placed. He had an inappropriate shock a few months ago for a-fib. He had an IABP  inserted this past Friday and is on dobutamine , as well as a Lasix drip. His end diastolic dimension is 6.8cm, his EF is 20-25% and his apex is akinetic. He has severe TR, mild-moderate PR, and mild RV dysfunction. A right heart cath performed when he was on dobutamine and milrinone 0.138mcg gave an RA pressure of 5, PCW 22, TPG 11, CO 4.7, CI 2.3 and MAP 28. His T-bili is 2.2-2.4,  albumin 3.6, and ammonia 52, so there is clearly some hepatic dysfunction. His MELD score is 16 and Child score 6-8. His liver biopsy did not show any cirrhosis. He does not have diabetes. He does have some ascites, but not too bad. He also has some pulmonary restriction. His creatinine has been running between 1.5-1.8 and the Urology team would like to do a cystoscopy to investigate his polyps but can't do so currently on account of the IABP. His bilateral ICAs show 16-49% diameter reduction. Psychiatry has seen him and they feel that his depression and anxiety are largely due to his current circumstances, although they do appear to be worse when his husband is not with him. He has a tremor that could be potentially due to metabolic issues, but there is concern that there is a Parkkinsonian aspect. He scored a 26 on a mini-mental status test so there is also worry that he may be developing dementia.  According to providers at the [facility] his neurological status has never been completely within normal range. He cannot be transplanted here due to [place] restrictions, so it would probably be best to tune him up for transfer back to the [place], and then if they want to send him to [facility] for advanced therapy consideration, they can.  ____________________________________________________________________________________    Date: 03/30/2014  Entry By: Inge Rise [id number]    Care Planning  Reginald Lucas, Reginald Lucas - Reginald Lucas is a gentleman who was referred by the [place]. There are concerns about his neurological status, though there is some chance this is related to his being sedated. He has been on Welbutrin and oxy, which has the potential to be problematic. He underwent a liver biopsy but pathology isn't back yet. It is possible that he may be experiencing some hepatic encephalopathy. He does have a very supportive husband but Mr. Ruelas cannot be said to be entirely on board with advanced options at this  point.

## 2019-09-17 NOTE — Historical Transplant Studies (Signed)
Milo, Schreier ([id number])  Heart Pre-Transplant Studies  Referral Date: 04-29-2014  Status: Died  ____________________________________________________________________________________    Primary Diagnosis: Dilated Myopathy: Ischemic dobutamine and milrinone  ABO: A     Social History: Married to Dan x20yr, together x1yrs. They live in [place], [place]. 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   Chewed?     Recreational Drug History:     UAs:  B - 57, 58  ____________________________________________________________________________________    Study: Right Heart Cath  Date: 03/31/2014  RA: 25 mm/Hg  PA(sys): 50 mm/Hg  PA(dia): 25 mm/Hg  PA(mean): 33 mm/Hg  PCW(mean): 22 mm/Hg  CO: 4.5 L/min  CI: 2.27 L/min/m2  Fick CO: 4.7 mm/Hg  Fick CI: 2.34 mm/Hg  SVR: 970  PVR: 2.34  TPG: 11  Inotropes/Vasodilators: Yes dobutamine and milrinone

## 2020-01-09 ENCOUNTER — Other Ambulatory Visit: Payer: Self-pay | Admitting: Cardiology

## 2020-07-23 ENCOUNTER — Other Ambulatory Visit: Payer: Self-pay | Admitting: Cardiology

## 2020-08-28 ENCOUNTER — Other Ambulatory Visit: Payer: Self-pay

## 2020-08-28 ENCOUNTER — Telehealth: Payer: Self-pay

## 2020-08-28 ENCOUNTER — Encounter: Payer: Self-pay | Admitting: Gastroenterology

## 2020-08-28 ENCOUNTER — Ambulatory Visit: Payer: Federal, State, Local not specified - PPO | Admitting: Gastroenterology

## 2020-08-28 VITALS — BP 110/62 | HR 69 | Ht 68.0 in | Wt 175.0 lb

## 2020-08-28 DIAGNOSIS — Z86010 Personal history of colonic polyps: Secondary | ICD-10-CM

## 2020-08-28 DIAGNOSIS — Z7902 Long term (current) use of antithrombotics/antiplatelets: Secondary | ICD-10-CM | POA: Diagnosis not present

## 2020-08-28 NOTE — Patient Instructions (Addendum)
It was a pleasure to meet you today. Based on our discussion, I am providing you with my recommendations below:  RECOMMENDATION(S):   I am recommending that we further evaluate your symptoms with a colonoscopy.  COLONOSCOPY:    You have been scheduled for a colonoscopy. Please follow written instructions given to you at your visit today.   PREP:    Please pick up your prep supplies at the pharmacy within the next 1-3 days.  INHALERS:    If you use inhalers (even only as needed), please bring them with you on the day of your procedure.  MEDICATIONS TO HOLD:   We will contact your provider to request permission for you to hold Eliquis. Once we receive a response, you will be contacted by our office. If you do not hear from our office 1 week prior to your scheduled procedure, please contact our office at [phone].   COLONOSCOPY TIPS:   To reduce nausea and dehydration, stay well hydrated for 3-4 days prior to the exam.   To prevent skin/hemorrhoid irritation - prior to wiping, put A&Dointment or vaseline on the toilet paper.  Keep a towel or pad on the bed.   BEFORE STARTING YOUR PREP, drink  64oz of clear liquids in the morning. This will help to flush the colon and will ensure you are well hydrated!!!!  NOTE - This is in addition to the fluids required for to complete your prep.  Use of a flavored hard candy, such as grape Anise Salvo, can counteract some of the flavor of the prep and may prevent some nausea.   BMI:  If you are age 55 or older, your body mass index should be between 23-30. Your There is no height or weight on file to calculate BMI. If this is out of the aforementioned range listed, please consider follow up with your Primary Care Provider.  Thank you for trusting me with your gastrointestinal care!    Thornton Park, MD, MPH

## 2020-08-28 NOTE — Telephone Encounter (Signed)
[facility] Pre-operative Risk Assessment     Matthew Mason [birth year] [id number]  Procedure: Colonoscopy Anesthesia type:  MAC Procedure Date: 10/10/20 Provider: Dr. Tarri Glenn  Type of Clearance needed: Pharmacy  Medication(s) needing held: Eliquis   Length of time for medication to be held: 5 days  Please review request and advise by either responding to this message or by sending your response to the fax # provided below.  Thank you,  [facility]  Phone: [phone] Fax: [phone] ATTENTION: Takeo Harts, LPN

## 2020-08-28 NOTE — Progress Notes (Signed)
Referring Provider: Denita Lung, MD Primary Care Physician:  Fanny Bien, MD  Reason for Consultation:  History of colon polyps   IMPRESSION:  History of colon polyps Longterm antiplatelet medication (prescribed by Dr. Ellyn Hack)  Surveillance colonoscopy is overdue. I have recommended holding Eliquis for 2 days before endoscopy.  I discussed with the patient that there is a low, but real, risk of a cardiovascular event such as heart attack, stroke, or embolism/thrombosis while off Eliquis. Will communicate by EMR with Dr. Ellyn Hack to confirm that holding the Eliquis is appropriate at this time.    PLAN: Colonoscopy after an Eliquis washout  Please see the "Patient Instructions" section for addition details about the plan.  HPI: LONGINO TREFZ is a 70 y.o. male referred by Dr. Ernie Hew.  The history is obtained through the patient, records provided by Dr. Ernie Hew, and review of his electronic health record.  He has atrial fibrillation on Eliquis.   Colonoscopy with Dr. Maurene Capes 07/04/2005 showed hemorrhoids and a small ascending colon tubular adenoma. Surveillance colonoscopy recommended in 5 years. He returns now ready to proceed.   GI ROS of symptoms. No GI evaluation since 2007.   No known family history of colon cancer or polyps. No family history of uterine/endometrial cancer, pancreatic cancer or gastric/stomach cancer.   Past Medical History:  Diagnosis Date  . Arthritis    knee  . Dysrhythmia   . Hemorrhoids   . HOH (hard of hearing)    Bilateral hearing aids  . Hypertension    Dr. Ellyn Hack took patient off BP meds  . Paroxysmal atrial fibrillation ([place]) 2012   Several short episodes on Event Monitor aftrer initial hospitalization. -- On PRN Diltiazem & ASA  . Personal history of colonic polyps     Past Surgical History:  Procedure Laterality Date  . COLONOSCOPY  01/07   Dr. Olevia Perches  . KNEE ARTHROSCOPY Right   . NM MYOVIEW LTD  June 2012   Treadmill: Walks  11.5 minutes, 12 METs, no ischemia or infarction  . PARTIAL KNEE ARTHROPLASTY Right 09/01/2014   Procedure: RIGHT  UNICOMPARTMENTAL KNEE;  Surgeon: Elsie Saas, MD;  Location: [facility];  Service: Orthopedics;  Laterality: Right;  . TRANSTHORACIC ECHOCARDIOGRAM  June 2012    Normal LV size and function, EF>55%,Gr 1 DD, no regional WMA, Trace MR.    Current Outpatient Medications  Medication Sig Dispense Refill  . ELIQUIS 5 MG TABS tablet TAKE 1 TABLET(5 MG) BY MOUTH TWICE DAILY 180 tablet 1  . flecainide (TAMBOCOR) 100 MG tablet TAKE 1 TABLET BY MOUTH AS DIRECTED 12 tablet 4  . metoprolol tartrate (LOPRESSOR) 25 MG tablet TAKE BY MOUTH AS DIRECTED 12 tablet 1   No current facility-administered medications for this visit.    Allergies as of 08/28/2020  . (No Known Allergies)    Family History  Problem Relation Age of Onset  . Heart disease Mother        Arrhythmia with ablation  . Heart disease Father 54       MI  . Cancer Brother 47       Prostate cancer     Review of Systems: 12 system ROS is negative except as noted above except for back pain.   Physical Exam: General:   Alert,  well-nourished, pleasant and cooperative in NAD Head:  Normocephalic and atraumatic. Eyes:  Sclera clear, no icterus.   Conjunctiva pink. Ears:  Normal auditory acuity. Nose:  No deformity, discharge,  or lesions.  Mouth:  No deformity or lesions.   Neck:  Supple; no masses or thyromegaly. Lungs:  Clear throughout to auscultation.   No wheezes. Heart:  Regular rate and rhythm; no murmurs. Abdomen:  Soft, nontender, nondistended, normal bowel sounds, no rebound or guarding. No hepatosplenomegaly.   Rectal:  Deferred  Msk:  Symmetrical. No boney deformities LAD: No inguinal or umbilical LAD Extremities:  No clubbing or edema. Neurologic:  Alert and  oriented x4;  grossly nonfocal Skin:  Intact without significant lesions or rashes. Psych:  Alert and cooperative. Normal mood and  affect.     Jazsmine Macari L. Tarri Glenn, MD, MPH 08/28/2020, 10:49 AM

## 2020-08-29 NOTE — Telephone Encounter (Signed)
Yes-ok to hold  Matthew Hew, MD

## 2020-08-30 NOTE — Telephone Encounter (Signed)
Called pt to advise, per guidelines, hold Eliquis x2 days prior to procedure. Verbalized acceptance and understanding.

## 2020-10-10 ENCOUNTER — Other Ambulatory Visit: Payer: Self-pay

## 2020-10-10 ENCOUNTER — Ambulatory Visit (AMBULATORY_SURGERY_CENTER): Payer: Federal, State, Local not specified - PPO | Admitting: Gastroenterology

## 2020-10-10 ENCOUNTER — Encounter: Payer: Self-pay | Admitting: Gastroenterology

## 2020-10-10 VITALS — BP 129/81 | HR 55 | Temp 97.8°F | Resp 15 | Ht 68.0 in | Wt 175.0 lb

## 2020-10-10 DIAGNOSIS — D123 Benign neoplasm of transverse colon: Secondary | ICD-10-CM | POA: Diagnosis not present

## 2020-10-10 DIAGNOSIS — D120 Benign neoplasm of cecum: Secondary | ICD-10-CM | POA: Diagnosis not present

## 2020-10-10 DIAGNOSIS — Z86010 Personal history of colonic polyps: Secondary | ICD-10-CM

## 2020-10-10 DIAGNOSIS — D122 Benign neoplasm of ascending colon: Secondary | ICD-10-CM

## 2020-10-10 DIAGNOSIS — D125 Benign neoplasm of sigmoid colon: Secondary | ICD-10-CM

## 2020-10-10 MED ORDER — SODIUM CHLORIDE 0.9 % IV SOLN: 500.0000 mL | Freq: Once | INTRAVENOUS | Status: DC

## 2020-10-10 NOTE — Patient Instructions (Signed)
HANDOUTS PROVIDED ON: POLYPS, DIVERTICULOSIS, & HEMORRHOIDS  The polyps removed today have been sent for pathology.  The results can take 1-3 weeks to receive.  When your next colonoscopy should occur will be based on the pathology results.    You may resume your previous diet and medication schedule. YOU MAY RESUME YOUR ELIQUIS TOMORROW (Thursday 4/21)  Thank you for allowing [place] to care for you today!!!   YOU HAD AN ENDOSCOPIC PROCEDURE TODAY AT [facility]:   Refer to the procedure report that was given to you for any specific questions about what was found during the examination.  If the procedure report does not answer your questions, please call your gastroenterologist to clarify.  If you requested that your care partner not be given the details of your procedure findings, then the procedure report has been included in a sealed envelope for you to review at your convenience later.  YOU SHOULD EXPECT: Some feelings of bloating in the abdomen. Passage of more gas than usual.  Walking can help get rid of the air that was put into your GI tract during the procedure and reduce the bloating. If you had a lower endoscopy (such as a colonoscopy or flexible sigmoidoscopy) you may notice spotting of blood in your stool or on the toilet paper. If you underwent a bowel prep for your procedure, you may not have a normal bowel movement for a few days.  Please Note:  You might notice some irritation and congestion in your nose or some drainage.  This is from the oxygen used during your procedure.  There is no need for concern and it should clear up in a day or so.  SYMPTOMS TO REPORT IMMEDIATELY:   Following lower endoscopy (colonoscopy or flexible sigmoidoscopy):  Excessive amounts of blood in the stool  Significant tenderness or worsening of abdominal pains  Swelling of the abdomen that is new, acute  Fever of 100F or higher  For urgent or emergent issues, a gastroenterologist can  be reached at any hour by calling [phone]. Do not use MyChart messaging for urgent concerns.    DIET:  We do recommend a small meal at first, but then you may proceed to your regular diet.  Drink plenty of fluids but you should avoid alcoholic beverages for 24 hours.  ACTIVITY:  You should plan to take it easy for the rest of today and you should NOT DRIVE or use heavy machinery until tomorrow (because of the sedation medicines used during the test).    FOLLOW UP: Our staff will call the number listed on your records Friday morning between 7:15 am and 8:15 am following your procedure to check on you and address any questions or concerns that you may have regarding the information given to you following your procedure. If we do not reach you, we will leave a message.  We will attempt to reach you two times.  During this call, we will ask if you have developed any symptoms of COVID 19. If you develop any symptoms (ie: fever, flu-like symptoms, shortness of breath, cough etc.) before then, please call [phone].  If you test positive for Covid 19 in the 2 weeks post procedure, please call and report this information to [place].    If any biopsies were taken you will be contacted by phone or by letter within the next 1-3 weeks.  Please call us at [phone] if you have not heard about the biopsies in 3 weeks.  SIGNATURES/CONFIDENTIALITY: You and/or your care partner have signed paperwork which will be entered into your electronic medical record.  These signatures attest to the fact that that the information above on your After Visit Summary has been reviewed and is understood.  Full responsibility of the confidentiality of this discharge information lies with you and/or your care-partner.

## 2020-10-10 NOTE — Progress Notes (Signed)
A and O x3. Report to RN. Tolerated MAC anesthesia well.

## 2020-10-10 NOTE — Progress Notes (Signed)
Called to room to assist during endoscopic procedure.  Patient ID and intended procedure confirmed with present staff. Received instructions for my participation in the procedure from the performing physician.  

## 2020-10-10 NOTE — Op Note (Signed)
[facility] Patient Name: Matthew Mason Procedure Date: 10/10/2020 10:17 AM MRN: [id number] Endoscopist: Thornton Park MD, MD Age: 70 Referring MD:  Date of Birth: [birth year] Gender: Male Account #: [id number] Procedure:                Colonoscopy Indications:              Surveillance: Personal history of adenomatous                            polyps on last colonoscopy > 5 years ago                           Tubular adenoma on colonoscopy 2007                           No known family history of colon cancer or polyps Medicines:                Monitored Anesthesia Care Procedure:                Pre-Anesthesia Assessment:                           - Prior to the procedure, a History and Physical                            was performed, and patient medications and                            allergies were reviewed. The patient's tolerance of                            previous anesthesia was also reviewed. The risks                            and benefits of the procedure and the sedation                            options and risks were discussed with the patient.                            All questions were answered, and informed consent                            was obtained. Prior Anticoagulants: The patient has                            taken Eliquis (apixaban), last dose was 3 days                            prior to procedure. ASA Grade Assessment: II - A                            patient with mild systemic disease. After reviewing  the risks and benefits, the patient was deemed in                            satisfactory condition to undergo the procedure.                           After obtaining informed consent, the colonoscope                            was passed under direct vision. Throughout the                            procedure, the patient's blood pressure, pulse, and                            oxygen saturations were  monitored continuously. The                            Olympus CF-HQ190L [id number]) Colonoscope was                            introduced through the anus and advanced to the the                            cecum, identified by appendiceal orifice and                            ileocecal valve. A second forward view of the right                            colon was performed. The colonoscopy was performed                            with moderate difficulty due to poor endoscopic                            visualization. Successful completion of the                            procedure was aided by lavage. The retained liquid                            was largely in the right colon. The patient                            tolerated the procedure well. The quality of the                            bowel preparation was adequate. The ileocecal                            valve, appendiceal orifice, and rectum were  photographed. Scope In: 10:32:49 AM Scope Out: 10:51:02 AM Scope Withdrawal Time: 0 hours 15 minutes 49 seconds  Total Procedure Duration: 0 hours 18 minutes 13 seconds  Findings:                 The perianal and digital rectal examinations were                            normal.                           Non-bleeding internal hemorrhoids were found.                           Multiple small-mouthed diverticula were found in                            the sigmoid colon.                           A 2 mm polyp was found in the sigmoid colon. The                            polyp was sessile. The polyp was removed with a                            cold snare. Resection and retrieval were complete.                            Estimated blood loss was minimal.                           A 2 mm polyp was found in the transverse colon. The                            polyp was sessile. The polyp was removed with a                            cold snare. Resection and  retrieval were complete.                            Estimated blood loss was minimal.                           Three sessile polyps were found in the ascending                            colon. The polyps were 1 to 3 mm in size. These                            polyps were removed with a cold snare. Resection                            and retrieval were complete. Estimated blood loss  was minimal.                           A 10 mm serpiginous, sessile polyp was found in the                            cecum. The polyp was removed with a cold snare.                            Resection and retrieval were complete. Estimated                            blood loss was minimal.                           The exam was otherwise without abnormality on                            direct and retroflexion views. Complications:            No immediate complications. Estimated blood loss:                            Minimal. Estimated Blood Loss:     Estimated blood loss was minimal. Impression:               - Non-bleeding internal hemorrhoids.                           - Diverticulosis in the sigmoid colon.                           - One 2 mm polyp in the sigmoid colon, removed with                            a cold snare. Resected and retrieved.                           - One 2 mm polyp in the transverse colon, removed                            with a cold snare. Resected and retrieved.                           - Three 1 to 3 mm polyps in the ascending colon,                            removed with a cold snare. Resected and retrieved.                           - One 10 mm polyp in the cecum, removed with a cold                            snare. Resected and retrieved.                           -  The examination was otherwise normal on direct                            and retroflexion views. Recommendation:           - Patient has a contact number available for                             emergencies. The signs and symptoms of potential                            delayed complications were discussed with the                            patient. Return to normal activities tomorrow.                            Written discharge instructions were provided to the                            patient.                           - Resume previous diet. High fiber diet encouraged.                           - Continue present medications.                           - Resume Eliquis (apixaban) at prior dose tomorrow.                           - Await pathology results.                           - Repeat colonoscopy date to be determined after                            pending pathology results are reviewed for                            surveillance.                           - Emerging evidence supports eating a diet of                            fruits, vegetables, grains, calcium, and yogurt                            while reducing red meat and alcohol may reduce the                            risk of colon cancer.                           -  Given these results, all first degree relatives                            (brothers, sisters, children, parents) should start                            colon cancer screening at age 4.                           - Thank you for allowing me to be involved in your                            colon cancer prevention. Thornton Park MD, MD 10/10/2020 10:59:09 AM This report has been signed electronically.

## 2020-10-12 ENCOUNTER — Telehealth: Payer: Self-pay | Admitting: *Deleted

## 2020-10-12 ENCOUNTER — Telehealth: Payer: Self-pay

## 2020-10-12 NOTE — Telephone Encounter (Signed)
First follow up call attempt.  LVM. 

## 2020-10-12 NOTE — Telephone Encounter (Signed)
LVM

## 2020-10-18 ENCOUNTER — Encounter: Payer: Self-pay | Admitting: Gastroenterology

## 2020-11-02 ENCOUNTER — Other Ambulatory Visit: Payer: Self-pay

## 2020-11-02 ENCOUNTER — Telehealth: Payer: Self-pay | Admitting: Cardiology

## 2020-11-02 MED ORDER — ELIQUIS 5 MG PO TABS: 5.0000 mg | ORAL_TABLET | Freq: Two times a day (BID) | ORAL | 0 refills | Status: AC

## 2020-11-02 NOTE — Telephone Encounter (Signed)
*  STAT* If patient is at the pharmacy, call can be transferred to refill team.   1. Which medications need to be refilled? (please list name of each medication and dose if known) ELIQUIS 5 MG TABS tablet  2. Which pharmacy/location (including street and city if local pharmacy) is medication to be sent to? [id number] - [place], [place] - [address] AT [address] [place] [address] [place] [address]  3. Do they need a 30 day or 90 day supply? 30 day supply

## 2020-11-02 NOTE — Telephone Encounter (Signed)
Prescription refill request for Eliquis received. Indication:atrial fib Last office visit:needs OV Scr:0.7 1/22 Age: 70 Weight:79.4 kg  Prescription refilled

## 2024-05-30 ENCOUNTER — Telehealth: Payer: Self-pay

## 2024-05-30 NOTE — Telephone Encounter (Signed)
 Attempted to reach patient concerning colonoscopy recall; unable to speak with patient;  left message and number to the office for patient to call back and schedule appts;    If patient is still on ELIQUIS - will need OV instead of PV; if NOT on ELIQUIS , please note this and patient can then be scheduled for a PV and procedure;

## 2024-06-15 NOTE — Telephone Encounter (Signed)
 Attempted to reach patient concerning colonoscopy recall; unable to speak with patient;  left message and number to the office for patient to call back and schedule appts;    Please see previous
